# Patient Record
Sex: Female | Born: 1955 | Race: Black or African American | Hispanic: No | Smoking: Former smoker
Health system: Southern US, Community
[De-identification: ages and names within clinical notes are randomized; demographics above are authoritative.]

## PROBLEM LIST (undated history)

## (undated) DIAGNOSIS — J45909 Unspecified asthma, uncomplicated: Secondary | ICD-10-CM

## (undated) DIAGNOSIS — M1990 Unspecified osteoarthritis, unspecified site: Secondary | ICD-10-CM

## (undated) DIAGNOSIS — D649 Anemia, unspecified: Secondary | ICD-10-CM

## (undated) DIAGNOSIS — M419 Scoliosis, unspecified: Secondary | ICD-10-CM

## (undated) DIAGNOSIS — M5410 Radiculopathy, site unspecified: Secondary | ICD-10-CM

## (undated) DIAGNOSIS — F329 Major depressive disorder, single episode, unspecified: Secondary | ICD-10-CM

## (undated) DIAGNOSIS — K219 Gastro-esophageal reflux disease without esophagitis: Secondary | ICD-10-CM

## (undated) DIAGNOSIS — I10 Essential (primary) hypertension: Secondary | ICD-10-CM

## (undated) DIAGNOSIS — E119 Type 2 diabetes mellitus without complications: Secondary | ICD-10-CM

## (undated) DIAGNOSIS — F32A Depression, unspecified: Secondary | ICD-10-CM

## (undated) HISTORY — PX: OTHER SURGICAL HISTORY: SHX169

## (undated) HISTORY — PX: TONSILLECTOMY: SUR1361

## (undated) HISTORY — PX: KNEE ARTHROSCOPY: SUR90

---

## 2017-03-01 NOTE — Progress Notes (Signed)
02-23-17 Surgical clearance from Dr. Sherrin DaisyShough on chart.  09-06-16 EKG on chart

## 2017-03-01 NOTE — Patient Instructions (Signed)
Fredrich Birksatricia Skirvin  03/01/2017   Your procedure is scheduled on: 03-07-17   [facility]  Entrance Report to Admitting at 9:00 AM   Call this number if you have problems the morning of surgery [phone]   Remember: ONLY 1 PERSON MAY GO WITH YOU TO SHORT STAY TO GET  READY MORNING OF YOUR SURGERY.  Do not eat food or drink liquids :After Midnight.     Take these medicines the morning of surgery with A SIP OF WATER: Cymbalta (Duloxetine) and Pantoprazole (Protonix). You may also bring and use your nasal spray as needed.  DO NOT TAKE ANY DIABETIC MEDICATIONS DAY OF YOUR SURGERY                               You may not have any metal on your body including hair pins and              piercings  Do not wear jewelry, make-up, lotions, powders or perfumes, deodorant             Do not wear nail polish.  Do not shave  48 hours prior to surgery.              Do not bring valuables to the hospital. [facility] IS NOT             RESPONSIBLE   FOR VALUABLES.  Contacts, dentures or bridgework may not be worn into surgery.  Leave suitcase in the car. After surgery it may be brought to your room.                 Please read over the following fact sheets you were given: _____________________________________________________________________ How to Manage Your Diabetes Before and After Surgery  Why is it important to control my blood sugar before and after surgery? . Improving blood sugar levels before and after surgery helps healing and can limit problems. . A way of improving blood sugar control is eating a healthy diet by: o  Eating less sugar and carbohydrates o  Increasing activity/exercise o  Talking with your doctor about reaching your blood sugar goals . High blood sugars (greater than 180 mg/dL) can raise your risk of infections and slow your recovery, so you will need to focus on controlling your diabetes during the weeks before  surgery. . Make sure that the doctor who takes care of your diabetes knows about your planned surgery including the date and location.  How do I manage my blood sugar before surgery? . Check your blood sugar at least 4 times a day, starting 2 days before surgery, to make sure that the level is not too high or low. o Check your blood sugar the morning of your surgery when you wake up and every 2 hours until you get to the Short Stay unit. . If your blood sugar is less than 70 mg/dL, you will need to treat for low blood sugar: o Do not take insulin. o Treat a low blood sugar (less than 70 mg/dL) with  cup of clear juice (cranberry or apple), 4 glucose tablets, OR glucose gel. o Recheck blood sugar in 15 minutes after treatment (to make sure it is greater than 70 mg/dL). If your blood sugar is not greater than 70 mg/dL on recheck, call [phone] for further instructions. .Marland Kitchen  Report your blood sugar to the short stay nurse when you get to Short Stay.  . If you are admitted to the hospital after surgery: o Your blood sugar will be checked by the staff and you will probably be given insulin after surgery (instead of oral diabetes medicines) to make sure you have good blood sugar levels. o The goal for blood sugar control after surgery is 80-180 mg/dL.   WHAT DO I DO ABOUT MY DIABETES MEDICATION?  Marland Kitchen. Do not take oral diabetes medicines (pills) the morning of surgery.  . THE DAY BEFORE SURGERY, take your usual dose of Metformin and Januvia        Patient Signature:  Date:   Nurse Signature:  Date:   Reviewed and Endorsed by [facility] Patient Education Committee, August 2015            [facility] - Preparing for Surgery Before surgery, you can play an important role.  Because skin is not sterile, your skin needs to be as free of germs as possible.  You can reduce the number of germs on your skin by washing with CHG (chlorahexidine gluconate) soap before surgery.  CHG is an antiseptic  cleaner which kills germs and bonds with the skin to continue killing germs even after washing. Please DO NOT use if you have an allergy to CHG or antibacterial soaps.  If your skin becomes reddened/irritated stop using the CHG and inform your nurse when you arrive at Short Stay. Do not shave (including legs and underarms) for at least 48 hours prior to the first CHG shower.  You may shave your face/neck. Please follow these instructions carefully:  1.  Shower with CHG Soap the night before surgery and the  morning of Surgery.  2.  If you choose to wash your hair, wash your hair first as usual with your  normal  shampoo.  3.  After you shampoo, rinse your hair and body thoroughly to remove the  shampoo.                           4.  Use CHG as you would any other liquid soap.  You can apply chg directly  to the skin and wash                       Gently with a scrungie or clean washcloth.  5.  Apply the CHG Soap to your body ONLY FROM THE NECK DOWN.   Do not use on face/ open                           Wound or open sores. Avoid contact with eyes, ears mouth and genitals (private parts).                       Wash face,  Genitals (private parts) with your normal soap.             6.  Wash thoroughly, paying special attention to the area where your surgery  will be performed.  7.  Thoroughly rinse your body with warm water from the neck down.  8.  DO NOT shower/wash with your normal soap after using and rinsing off  the CHG Soap.                9.  Pat yourself dry with a clean towel.  10.  Wear clean pajamas.            11.  Place clean sheets on your bed the night of your first shower and do not  sleep with pets. Day of Surgery : Do not apply any lotions/deodorants the morning of surgery.  Please wear clean clothes to the hospital/surgery center.  FAILURE TO FOLLOW THESE INSTRUCTIONS MAY RESULT IN THE CANCELLATION OF YOUR SURGERY PATIENT  SIGNATURE_________________________________  NURSE SIGNATURE__________________________________  ________________________________________________________________________   Adam Phenix  An incentive spirometer is a tool that can help keep your lungs clear and active. This tool measures how well you are filling your lungs with each breath. Taking long deep breaths may help reverse or decrease the chance of developing breathing (pulmonary) problems (especially infection) following:  A long period of time when you are unable to move or be active. BEFORE THE PROCEDURE   If the spirometer includes an indicator to show your best effort, your nurse or respiratory therapist will set it to a desired goal.  If possible, sit up straight or lean slightly forward. Try not to slouch.  Hold the incentive spirometer in an upright position. INSTRUCTIONS FOR USE  1. Sit on the edge of your bed if possible, or sit up as far as you can in bed or on a chair. 2. Hold the incentive spirometer in an upright position. 3. Breathe out normally. 4. Place the mouthpiece in your mouth and seal your lips tightly around it. 5. Breathe in slowly and as deeply as possible, raising the piston or the ball toward the top of the column. 6. Hold your breath for 3-5 seconds or for as long as possible. Allow the piston or ball to fall to the bottom of the column. 7. Remove the mouthpiece from your mouth and breathe out normally. 8. Rest for a few seconds and repeat Steps 1 through 7 at least 10 times every 1-2 hours when you are awake. Take your time and take a few normal breaths between deep breaths. 9. The spirometer may include an indicator to show your best effort. Use the indicator as a goal to work toward during each repetition. 10. After each set of 10 deep breaths, practice coughing to be sure your lungs are clear. If you have an incision (the cut made at the time of surgery), support your incision when coughing  by placing a pillow or rolled up towels firmly against it. Once you are able to get out of bed, walk around indoors and cough well. You may stop using the incentive spirometer when instructed by your caregiver.  RISKS AND COMPLICATIONS  Take your time so you do not get dizzy or light-headed.  If you are in pain, you may need to take or ask for pain medication before doing incentive spirometry. It is harder to take a deep breath if you are having pain. AFTER USE  Rest and breathe slowly and easily.  It can be helpful to keep track of a log of your progress. Your caregiver can provide you with a simple table to help with this. If you are using the spirometer at home, follow these instructions: [place] IF:   You are having difficultly using the spirometer.  You have trouble using the spirometer as often as instructed.  Your pain medication is not giving enough relief while using the spirometer.  You develop fever of 100.5 F (38.1 C) or higher. SEEK IMMEDIATE MEDICAL CARE IF:   You cough up bloody  sputum that had not been present before.  You develop fever of 102 F (38.9 C) or greater.  You develop worsening pain at or near the incision site. MAKE SURE YOU:   Understand these instructions.  Will watch your condition.  Will get help right away if you are not doing well or get worse. Document Released: 07/18/2006 Document Revised: 05/30/2011 Document Reviewed: 09/18/2006 Exit[employer] Patient Information 2014 [employer], [place].   ________________________________________________________________________  WHAT IS A BLOOD TRANSFUSION? Blood Transfusion Information  A transfusion is the replacement of blood or some of its parts. Blood is made up of multiple cells which provide different functions.  Red blood cells carry oxygen and are used for blood loss replacement.  White blood cells fight against infection.  Platelets control bleeding.  Plasma helps clot  blood.  Other blood products are available for specialized needs, such as hemophilia or other clotting disorders. BEFORE THE TRANSFUSION  Who gives blood for transfusions?   Healthy volunteers who are fully evaluated to make sure their blood is safe. This is blood bank blood. Transfusion therapy is the safest it has ever been in the practice of medicine. Before blood is taken from a donor, a complete history is taken to make sure that person has no history of diseases nor engages in risky social behavior (examples are intravenous drug use or sexual activity with multiple partners). The donor's travel history is screened to minimize risk of transmitting infections, such as malaria. The donated blood is tested for signs of infectious diseases, such as HIV and hepatitis. The blood is then tested to be sure it is compatible with you in order to minimize the chance of a transfusion reaction. If you or a relative donates blood, this is often done in anticipation of surgery and is not appropriate for emergency situations. It takes many days to process the donated blood. RISKS AND COMPLICATIONS Although transfusion therapy is very safe and saves many lives, the main dangers of transfusion include:   Getting an infectious disease.  Developing a transfusion reaction. This is an allergic reaction to something in the blood you were given. Every precaution is taken to prevent this. The decision to have a blood transfusion has been considered carefully by your caregiver before blood is given. Blood is not given unless the benefits outweigh the risks. AFTER THE TRANSFUSION  Right after receiving a blood transfusion, you will usually feel much better and more energetic. This is especially true if your red blood cells have gotten low (anemic). The transfusion raises the level of the red blood cells which carry oxygen, and this usually causes an energy increase.  The nurse administering the transfusion will monitor  you carefully for complications. HOME CARE INSTRUCTIONS  No special instructions are needed after a transfusion. You may find your energy is better. Speak with your caregiver about any limitations on activity for underlying diseases you may have. SEEK MEDICAL CARE IF:   Your condition is not improving after your transfusion.  You develop redness or irritation at the intravenous (IV) site. SEEK IMMEDIATE MEDICAL CARE IF:  Any of the following symptoms occur over the next 12 hours:  Shaking chills.  You have a temperature by mouth above 102 F (38.9 C), not controlled by medicine.  Chest, back, or muscle pain.  People around you feel you are not acting correctly or are confused.  Shortness of breath or difficulty breathing.  Dizziness and fainting.  You get a rash or develop hives.  You have a  decrease in urine output.  Your urine turns a dark color or changes to pink, red, or brown. Any of the following symptoms occur over the next 10 days:  You have a temperature by mouth above 102 F (38.9 C), not controlled by medicine.  Shortness of breath.  Weakness after normal activity.  The white part of the eye turns yellow (jaundice).  You have a decrease in the amount of urine or are urinating less often.  Your urine turns a dark color or changes to pink, red, or brown. Document Released: 03/04/2000 Document Revised: 05/30/2011 Document Reviewed: 10/22/2007 [facility] Patient Information 2014 [place], [place].  _______________________________________________________________________

## 2017-03-02 ENCOUNTER — Other Ambulatory Visit: Payer: Self-pay

## 2017-03-02 ENCOUNTER — Encounter (HOSPITAL_COMMUNITY)
Admission: RE | Admit: 2017-03-02 | Discharge: 2017-03-02 | Disposition: A | Discharge status: Home / Self Care | Payer: Medicare HMO | Source: Ambulatory Visit | Attending: Orthopedic Surgery | Admitting: Orthopedic Surgery

## 2017-03-02 ENCOUNTER — Encounter (INDEPENDENT_AMBULATORY_CARE_PROVIDER_SITE_OTHER): Payer: Self-pay

## 2017-03-02 ENCOUNTER — Encounter (HOSPITAL_COMMUNITY): Payer: Self-pay

## 2017-03-02 DIAGNOSIS — J45909 Unspecified asthma, uncomplicated: Secondary | ICD-10-CM | POA: Diagnosis not present

## 2017-03-02 DIAGNOSIS — I10 Essential (primary) hypertension: Secondary | ICD-10-CM | POA: Diagnosis not present

## 2017-03-02 DIAGNOSIS — Z01812 Encounter for preprocedural laboratory examination: Secondary | ICD-10-CM | POA: Insufficient documentation

## 2017-03-02 DIAGNOSIS — K219 Gastro-esophageal reflux disease without esophagitis: Secondary | ICD-10-CM | POA: Diagnosis not present

## 2017-03-02 DIAGNOSIS — E119 Type 2 diabetes mellitus without complications: Secondary | ICD-10-CM | POA: Insufficient documentation

## 2017-03-02 DIAGNOSIS — F329 Major depressive disorder, single episode, unspecified: Secondary | ICD-10-CM | POA: Diagnosis not present

## 2017-03-02 DIAGNOSIS — M25552 Pain in left hip: Secondary | ICD-10-CM | POA: Diagnosis not present

## 2017-03-02 DIAGNOSIS — D649 Anemia, unspecified: Secondary | ICD-10-CM | POA: Insufficient documentation

## 2017-03-02 DIAGNOSIS — M1612 Unilateral primary osteoarthritis, left hip: Secondary | ICD-10-CM | POA: Insufficient documentation

## 2017-03-02 HISTORY — DX: Major depressive disorder, single episode, unspecified: F32.9

## 2017-03-02 HISTORY — DX: Depression, unspecified: F32.A

## 2017-03-02 HISTORY — DX: Gastro-esophageal reflux disease without esophagitis: K21.9

## 2017-03-02 HISTORY — DX: Essential (primary) hypertension: I10

## 2017-03-02 HISTORY — DX: Type 2 diabetes mellitus without complications: E11.9

## 2017-03-02 HISTORY — DX: Anemia, unspecified: D64.9

## 2017-03-02 HISTORY — DX: Unspecified osteoarthritis, unspecified site: M19.90

## 2017-03-02 HISTORY — DX: Unspecified asthma, uncomplicated: J45.909

## 2017-03-02 LAB — BASIC METABOLIC PANEL
Anion gap: 9 (ref 5–15)
BUN: 25 mg/dL — AB (ref 6–20)
CALCIUM: 9.6 mg/dL (ref 8.9–10.3)
CO2: 25 mmol/L (ref 22–32)
CREATININE: 0.78 mg/dL (ref 0.44–1.00)
Chloride: 103 mmol/L (ref 101–111)
GFR calc non Af Amer: >60 mL/min (ref >60)
GLUCOSE: 115 mg/dL — AB (ref 65–99)
Potassium: 4 mmol/L (ref 3.5–5.1)
Sodium: 137 mmol/L (ref 135–145)

## 2017-03-02 LAB — CBC
HCT: 35.1 % — ABNORMAL LOW (ref 36.0–46.0)
Hemoglobin: 11.5 g/dL — ABNORMAL LOW (ref 12.0–15.0)
MCH: 28.8 pg (ref 26.0–34.0)
MCHC: 32.8 g/dL (ref 30.0–36.0)
MCV: 88 fL (ref 78.0–100.0)
PLATELETS: 257 10*3/uL (ref 150–400)
RBC: 3.99 MIL/uL (ref 3.87–5.11)
RDW: 12.9 % (ref 11.5–15.5)
WBC: 8 10*3/uL (ref 4.0–10.5)

## 2017-03-02 LAB — SURGICAL PCR SCREEN
MRSA, PCR: NEGATIVE
Staphylococcus aureus: NEGATIVE

## 2017-03-02 LAB — GLUCOSE, CAPILLARY: GLUCOSE-CAPILLARY: 113 mg/dL — AB (ref 65–99)

## 2017-03-02 LAB — ABO/RH: ABO/RH(D): AB POS

## 2017-03-02 NOTE — Progress Notes (Signed)
03-02-17 BMP result routed to Dr. Charlann Boxerlin for review.

## 2017-03-02 NOTE — H&P (Signed)
TOTAL HIP ADMISSION H&P  Patient is admitted for left total hip arthroplasty, anterior approach.  Subjective:  Chief Complaint:   Left hip primary OA / pain  HPI: Ariel Harrington, 61 y.o. female, has a history of pain and functional disability in the left hip(s) due to arthritis and patient has failed non-surgical conservative treatments for greater than 12 weeks to include  NSAID's and/or analgesics, use of assistive devices and activity modification.  Onset of symptoms was abrupt starting 1+ years ago with rapidlly worsening course since that time.The patient noted no past surgery on the left hip(s).  Patient currently rates pain in the left hip at 10 out of 10 with activity. Patient has night pain, worsening of pain with activity and weight bearing, trendelenberg gait, pain that interfers with activities of daily living and pain with passive range of motion. Patient has evidence of periarticular osteophytes, joint space narrowing and fracture by imaging studies. This condition presents safety issues increasing the risk of falls.  There is no current active infection.  Risks, benefits and expectations were discussed with the patient.  Risks including but not limited to the risk of anesthesia, blood clots, nerve damage, blood vessel damage, failure of the prosthesis, infection and up to and including death.  Patient understand the risks, benefits and expectations and wishes to proceed with surgery.   PCP: Legrand Pitts, PA  D/C Plans:       Home  Post-op Meds:       No Rx given  Tranexamic Acid:      To be given - IV   Decadron:      Is to be given  FYI:     Xarelto (no able to tolerate much ASA)  Oxycodone 5-10  (on tid prior to surgery)  DME:   Pt already has equipment  PT:   No PT    Past Medical History:  Diagnosis Date  . Anemia   . Arthritis   . Asthma   . Depression   . Diabetes mellitus without complication (HCC)   . GERD (gastroesophageal reflux disease)   .  Hypertension     Past Surgical History:  Procedure Laterality Date  . KNEE ARTHROSCOPY Right   . shoulder    . TONSILLECTOMY      No current facility-administered medications for this encounter.    Current Outpatient Medications  Medication Sig Dispense Refill Last Dose  . albuterol (PROVENTIL HFA;VENTOLIN HFA) 108 (90 Base) MCG/ACT inhaler Inhale 2 puffs into the lungs every 6 (six) hours as needed for wheezing or shortness of breath.     . DULoxetine (CYMBALTA) 60 MG capsule Take 60 mg by mouth daily.     . fluticasone (FLONASE) 50 MCG/ACT nasal spray Place 1-2 sprays into both nostrils 2 (two) times daily as needed for allergies or rhinitis.     . hydrocortisone butyrate (LUCOID) 0.1 % CREA cream Apply 1 application topically 3 (three) times daily.     . metFORMIN (GLUCOPHAGE) 1000 MG tablet Take 1,000 mg by mouth 2 (two) times daily with a meal.     . NON FORMULARY Apply 1 application topically 3 (three) times daily. Ketoprofen10%/Baclofen5%/Bupivicane1%     . olmesartan-hydrochlorothiazide (BENICAR HCT) 40-25 MG tablet Take 1 tablet by mouth daily.     Marland Kitchen oxyCODONE-acetaminophen (PERCOCET) 7.5-325 MG tablet Take 1 tablet by mouth 3 (three) times daily as needed for severe pain.     . pantoprazole (PROTONIX) 40 MG tablet Take 40 mg by mouth daily.     Marland Kitchen  sitaGLIPtin (JANUVIA) 50 MG tablet Take 50 mg by mouth daily.      Allergies  Allergen Reactions  . Bactrim [Sulfamethoxazole-Trimethoprim] Shortness Of Breath  . Aspirin Other (See Comments)    Irritates stomach  . Codeine Other (See Comments)    Sweat and get really hot  . Gabapentin Itching  . Ibuprofen Other (See Comments)    Irritates stomach  . Lyrica [Pregabalin] Other (See Comments)    Blister    Social History   Tobacco Use  . Smoking status: Former Smoker    Packs/day: 1.00    Years: 7.00    Pack years: 7.00    Types: Cigarettes    Last attempt to quit: 08/1974    Years since quitting: 42.5  . Smokeless  tobacco: Never Used  Substance Use Topics  . Alcohol use: No    Frequency: Never       Review of Systems  Constitutional: Negative.   HENT: Negative.   Eyes: Negative.   Respiratory: Negative.   Cardiovascular: Negative.   Gastrointestinal: Positive for heartburn.  Genitourinary: Negative.   Musculoskeletal: Positive for joint pain.  Skin: Negative.   Neurological: Negative.   Endo/Heme/Allergies: Negative.   Psychiatric/Behavioral: Positive for depression.    Objective:  Physical Exam  Constitutional: She is oriented to person, place, and time. She appears well-developed.  HENT:  Head: Normocephalic.  Eyes: Pupils are equal, round, and reactive to light.  Neck: Neck supple. No JVD present. No tracheal deviation present. No thyromegaly present.  Cardiovascular: Normal rate, regular rhythm and intact distal pulses.  Respiratory: Effort normal and breath sounds normal. No respiratory distress. She has no wheezes.  GI: Soft. There is no tenderness. There is no guarding.  Musculoskeletal:       Left hip: She exhibits decreased range of motion, decreased strength, tenderness and bony tenderness. She exhibits no swelling, no deformity and no laceration.  Lymphadenopathy:    She has no cervical adenopathy.  Neurological: She is alert and oriented to person, place, and time. A sensory deficit (numbness and tingling in the right toes) is present.  Skin: Skin is warm and dry.  Psychiatric: She has a normal mood and affect.    Vital signs in last 24 hours: Temp:  [98.8 F (37.1 C)] 98.8 F (37.1 C) (12/13 1149) Pulse Rate:  [84] 84 (12/13 1149) Resp:  [18] 18 (12/13 1149) BP: (128)/(68) 128/68 (12/13 1149) SpO2:  [99 %] 99 % (12/13 1149) Weight:  [82.3 kg (181 lb 6 oz)] 82.3 kg (181 lb 6 oz) (12/13 1149)  Labs:   Estimated body mass index is 29.27 kg/m as calculated from the following:   Height as of 03/02/17: 5\' 6"  (1.676 m).   Weight as of 03/02/17: 82.3 kg (181 lb 6  oz).   Imaging Review Plain radiographs demonstrate severe degenerative joint disease of the left hip(s). The bone quality appears to be good for age and reported activity level.  Assessment/Plan:  End stage arthritis, left hip  The patient history, physical examination, clinical judgement of the provider and imaging studies are consistent with end stage degenerative joint disease of the left hip(s) and total hip arthroplasty is deemed medically necessary. The treatment options including medical management, injection therapy, arthroscopy and arthroplasty were discussed at length. The risks and benefits of total hip arthroplasty were presented and reviewed. The risks due to aseptic loosening, infection, stiffness, dislocation/subluxation,  thromboembolic complications and other imponderables were discussed.  The patient acknowledged the explanation, agreed  to proceed with the plan and consent was signed. Patient is being admitted for inpatient treatment for surgery, pain control, PT, OT, prophylactic antibiotics, VTE prophylaxis, progressive ambulation and ADL's and discharge planning.The patient is planning to be discharged home.    Anastasio AuerbachMatthew S. Shamirah Ivan   PA-C  03/02/2017, 2:00 PM

## 2017-03-03 NOTE — Pre-Procedure Instructions (Signed)
Spoke with Jacki ConesLaurie in lab about HGB A1C results stated [facility] machine went down so the labs may have been sent to [facility].  I will check back for results on Monday, 03/06/17.

## 2017-03-04 LAB — HEMOGLOBIN A1C
HEMOGLOBIN A1C: 6.6 % — AB (ref 4.8–5.6)
MEAN PLASMA GLUCOSE: 143 mg/dL

## 2017-03-06 NOTE — Pre-Procedure Instructions (Signed)
Hgb A 1 C faxed to Dr. Charlann Boxerlin via epic.

## 2017-03-07 ENCOUNTER — Other Ambulatory Visit: Payer: Self-pay

## 2017-03-07 ENCOUNTER — Encounter (HOSPITAL_COMMUNITY): Payer: Self-pay

## 2017-03-07 ENCOUNTER — Inpatient Hospital Stay (HOSPITAL_COMMUNITY): Payer: Medicare HMO | Admitting: Anesthesiology

## 2017-03-07 ENCOUNTER — Inpatient Hospital Stay (HOSPITAL_COMMUNITY): Payer: Medicare HMO

## 2017-03-07 ENCOUNTER — Encounter (HOSPITAL_COMMUNITY)
Admission: RE | Disposition: A | Discharge status: Home / Self Care | Payer: Self-pay | Source: Ambulatory Visit | Attending: Orthopedic Surgery

## 2017-03-07 ENCOUNTER — Inpatient Hospital Stay (HOSPITAL_COMMUNITY)
Admission: RE | Admit: 2017-03-07 | Discharge: 2017-03-10 | DRG: 470 | Disposition: A | Discharge status: Home / Self Care | Payer: Medicare HMO | Source: Ambulatory Visit | Attending: Orthopedic Surgery | Admitting: Orthopedic Surgery

## 2017-03-07 DIAGNOSIS — Z87891 Personal history of nicotine dependence: Secondary | ICD-10-CM | POA: Diagnosis not present

## 2017-03-07 DIAGNOSIS — Z79899 Other long term (current) drug therapy: Secondary | ICD-10-CM

## 2017-03-07 DIAGNOSIS — F329 Major depressive disorder, single episode, unspecified: Secondary | ICD-10-CM | POA: Diagnosis present

## 2017-03-07 DIAGNOSIS — Z886 Allergy status to analgesic agent status: Secondary | ICD-10-CM | POA: Diagnosis not present

## 2017-03-07 DIAGNOSIS — Z881 Allergy status to other antibiotic agents status: Secondary | ICD-10-CM | POA: Diagnosis not present

## 2017-03-07 DIAGNOSIS — M8588 Other specified disorders of bone density and structure, other site: Secondary | ICD-10-CM | POA: Diagnosis present

## 2017-03-07 DIAGNOSIS — M1612 Unilateral primary osteoarthritis, left hip: Secondary | ICD-10-CM | POA: Diagnosis present

## 2017-03-07 DIAGNOSIS — Z9181 History of falling: Secondary | ICD-10-CM | POA: Diagnosis not present

## 2017-03-07 DIAGNOSIS — Z885 Allergy status to narcotic agent status: Secondary | ICD-10-CM

## 2017-03-07 DIAGNOSIS — E663 Overweight: Secondary | ICD-10-CM | POA: Diagnosis present

## 2017-03-07 DIAGNOSIS — D62 Acute posthemorrhagic anemia: Secondary | ICD-10-CM | POA: Diagnosis not present

## 2017-03-07 DIAGNOSIS — I10 Essential (primary) hypertension: Secondary | ICD-10-CM | POA: Diagnosis present

## 2017-03-07 DIAGNOSIS — Z888 Allergy status to other drugs, medicaments and biological substances status: Secondary | ICD-10-CM

## 2017-03-07 DIAGNOSIS — J45909 Unspecified asthma, uncomplicated: Secondary | ICD-10-CM | POA: Diagnosis present

## 2017-03-07 DIAGNOSIS — M25552 Pain in left hip: Secondary | ICD-10-CM | POA: Diagnosis present

## 2017-03-07 DIAGNOSIS — Z7984 Long term (current) use of oral hypoglycemic drugs: Secondary | ICD-10-CM | POA: Diagnosis not present

## 2017-03-07 DIAGNOSIS — Z96649 Presence of unspecified artificial hip joint: Secondary | ICD-10-CM

## 2017-03-07 DIAGNOSIS — M659 Synovitis and tenosynovitis, unspecified: Secondary | ICD-10-CM | POA: Diagnosis present

## 2017-03-07 DIAGNOSIS — Z6829 Body mass index (BMI) 29.0-29.9, adult: Secondary | ICD-10-CM

## 2017-03-07 DIAGNOSIS — K219 Gastro-esophageal reflux disease without esophagitis: Secondary | ICD-10-CM | POA: Diagnosis present

## 2017-03-07 DIAGNOSIS — E119 Type 2 diabetes mellitus without complications: Secondary | ICD-10-CM | POA: Diagnosis present

## 2017-03-07 HISTORY — PX: TOTAL HIP ARTHROPLASTY: SHX124

## 2017-03-07 LAB — GLUCOSE, CAPILLARY
GLUCOSE-CAPILLARY: 111 mg/dL — AB (ref 65–99)
GLUCOSE-CAPILLARY: 211 mg/dL — AB (ref 65–99)
Glucose-Capillary: 186 mg/dL — ABNORMAL HIGH (ref 65–99)
Glucose-Capillary: 144 mg/dL — ABNORMAL HIGH (ref 65–99)

## 2017-03-07 SURGERY — ARTHROPLASTY, HIP, TOTAL, ANTERIOR APPROACH
Anesthesia: Spinal | Site: Hip | Laterality: Left

## 2017-03-07 MED ORDER — FENTANYL CITRATE (PF) 100 MCG/2ML IJ SOLN
INTRAMUSCULAR | Status: AC
  Filled 2017-03-07: qty 2

## 2017-03-07 MED ORDER — ONDANSETRON HCL 4 MG/2ML IJ SOLN: 4.0000 mg | Freq: Four times a day (QID) | INTRAMUSCULAR | Status: DC | PRN

## 2017-03-07 MED ORDER — CEFAZOLIN SODIUM-DEXTROSE 2-4 GM/100ML-% IV SOLN
2.0000 g | Freq: Four times a day (QID) | INTRAVENOUS | Status: AC
  Administered 2017-03-07 (×2): 2 g via INTRAVENOUS
  Filled 2017-03-07 (×2): qty 100

## 2017-03-07 MED ORDER — PROPOFOL 500 MG/50ML IV EMUL
INTRAVENOUS | Status: DC | PRN
  Administered 2017-03-07: 50 ug/kg/min via INTRAVENOUS

## 2017-03-07 MED ORDER — TRANEXAMIC ACID 1000 MG/10ML IV SOLN
1000.0000 mg | Freq: Once | INTRAVENOUS | Status: AC
  Administered 2017-03-07: 1000 mg via INTRAVENOUS
  Filled 2017-03-07: qty 1100

## 2017-03-07 MED ORDER — STERILE WATER FOR IRRIGATION IR SOLN
Status: DC | PRN
  Administered 2017-03-07: 2000 mL

## 2017-03-07 MED ORDER — DULOXETINE HCL 60 MG PO CPEP
60.0000 mg | ORAL_CAPSULE | Freq: Every day | ORAL | Status: DC
  Administered 2017-03-08 – 2017-03-10 (×3): 60 mg via ORAL
  Filled 2017-03-07 (×3): qty 1

## 2017-03-07 MED ORDER — FLUTICASONE PROPIONATE 50 MCG/ACT NA SUSP
1.0000 | Freq: Two times a day (BID) | NASAL | Status: DC | PRN
  Filled 2017-03-07: qty 16

## 2017-03-07 MED ORDER — MIDAZOLAM HCL 5 MG/5ML IJ SOLN
INTRAMUSCULAR | Status: DC | PRN
  Administered 2017-03-07: 2 mg via INTRAVENOUS

## 2017-03-07 MED ORDER — FENTANYL CITRATE (PF) 100 MCG/2ML IJ SOLN
INTRAMUSCULAR | Status: DC | PRN
  Administered 2017-03-07: 100 ug via INTRAVENOUS

## 2017-03-07 MED ORDER — DOCUSATE SODIUM 100 MG PO CAPS
100.0000 mg | ORAL_CAPSULE | Freq: Two times a day (BID) | ORAL | Status: DC
  Administered 2017-03-07 – 2017-03-10 (×6): 100 mg via ORAL
  Filled 2017-03-07 (×6): qty 1

## 2017-03-07 MED ORDER — METHOCARBAMOL 1000 MG/10ML IJ SOLN
500.0000 mg | Freq: Four times a day (QID) | INTRAMUSCULAR | Status: DC | PRN
  Administered 2017-03-07: 500 mg via INTRAVENOUS
  Filled 2017-03-07: qty 550

## 2017-03-07 MED ORDER — HYDROCHLOROTHIAZIDE 25 MG PO TABS
25.0000 mg | ORAL_TABLET | Freq: Every day | ORAL | Status: DC
  Administered 2017-03-07 – 2017-03-10 (×3): 25 mg via ORAL
  Filled 2017-03-07 (×4): qty 1

## 2017-03-07 MED ORDER — PROPOFOL 10 MG/ML IV BOLUS
INTRAVENOUS | Status: AC
Start: 2017-03-07 — End: 2017-03-07
  Filled 2017-03-07: qty 40

## 2017-03-07 MED ORDER — RIVAROXABAN 10 MG PO TABS
10.0000 mg | ORAL_TABLET | ORAL | Status: DC
  Administered 2017-03-08 – 2017-03-10 (×3): 10 mg via ORAL
  Filled 2017-03-07 (×3): qty 1

## 2017-03-07 MED ORDER — DOCUSATE SODIUM 100 MG PO CAPS: 100.0000 mg | ORAL_CAPSULE | Freq: Two times a day (BID) | ORAL | 0 refills | Status: DC

## 2017-03-07 MED ORDER — ONDANSETRON HCL 4 MG PO TABS: 4.0000 mg | ORAL_TABLET | Freq: Four times a day (QID) | ORAL | Status: DC | PRN

## 2017-03-07 MED ORDER — CEFAZOLIN SODIUM-DEXTROSE 2-4 GM/100ML-% IV SOLN
INTRAVENOUS | Status: AC
  Filled 2017-03-07: qty 100

## 2017-03-07 MED ORDER — DIPHENHYDRAMINE HCL 12.5 MG/5ML PO ELIX: 12.5000 mg | ORAL_SOLUTION | ORAL | Status: DC | PRN

## 2017-03-07 MED ORDER — PROPOFOL 10 MG/ML IV BOLUS
INTRAVENOUS | Status: AC
  Filled 2017-03-07: qty 20

## 2017-03-07 MED ORDER — INSULIN ASPART 100 UNIT/ML ~~LOC~~ SOLN
0.0000 [IU] | Freq: Three times a day (TID) | SUBCUTANEOUS | Status: DC
  Administered 2017-03-07: 19:00:00 5 [IU] via SUBCUTANEOUS
  Administered 2017-03-08: 17:00:00 3 [IU] via SUBCUTANEOUS
  Administered 2017-03-08: 08:00:00 2 [IU] via SUBCUTANEOUS
  Administered 2017-03-08: 5 [IU] via SUBCUTANEOUS
  Administered 2017-03-09: 13:00:00 3 [IU] via SUBCUTANEOUS
  Administered 2017-03-09 (×2): 2 [IU] via SUBCUTANEOUS
  Administered 2017-03-10: 14:00:00 3 [IU] via SUBCUTANEOUS
  Administered 2017-03-10: 09:00:00 2 [IU] via SUBCUTANEOUS

## 2017-03-07 MED ORDER — FERROUS SULFATE 325 (65 FE) MG PO TABS
325.0000 mg | ORAL_TABLET | Freq: Three times a day (TID) | ORAL | Status: DC
  Administered 2017-03-08 – 2017-03-10 (×8): 325 mg via ORAL
  Filled 2017-03-07 (×8): qty 1

## 2017-03-07 MED ORDER — RIVAROXABAN 10 MG PO TABS: 10.0000 mg | ORAL_TABLET | Freq: Every day | ORAL | 0 refills | Status: DC

## 2017-03-07 MED ORDER — ACETAMINOPHEN 500 MG PO TABS: 1000.0000 mg | ORAL_TABLET | Freq: Three times a day (TID) | ORAL | 0 refills | Status: DC

## 2017-03-07 MED ORDER — ACETAMINOPHEN 650 MG RE SUPP: 650.0000 mg | RECTAL | Status: DC | PRN

## 2017-03-07 MED ORDER — ONDANSETRON HCL 4 MG/2ML IJ SOLN
INTRAMUSCULAR | Status: DC | PRN
  Administered 2017-03-07: 4 mg via INTRAVENOUS

## 2017-03-07 MED ORDER — METOCLOPRAMIDE HCL 5 MG PO TABS: 5.0000 mg | ORAL_TABLET | Freq: Three times a day (TID) | ORAL | Status: DC | PRN

## 2017-03-07 MED ORDER — OXYCODONE HCL 5 MG PO TABS
ORAL_TABLET | ORAL | Status: AC
  Filled 2017-03-07: qty 1

## 2017-03-07 MED ORDER — OXYCODONE HCL 5 MG PO TABS: 5.0000 mg | ORAL_TABLET | ORAL | 0 refills | Status: DC | PRN

## 2017-03-07 MED ORDER — TRANEXAMIC ACID 1000 MG/10ML IV SOLN
1000.0000 mg | INTRAVENOUS | Status: AC
  Administered 2017-03-07: 1000 mg via INTRAVENOUS
  Filled 2017-03-07: qty 1100

## 2017-03-07 MED ORDER — ACETAMINOPHEN 325 MG PO TABS
650.0000 mg | ORAL_TABLET | ORAL | Status: DC | PRN
  Administered 2017-03-09: 22:00:00 650 mg via ORAL
  Filled 2017-03-07: qty 2

## 2017-03-07 MED ORDER — OLMESARTAN MEDOXOMIL-HCTZ 40-25 MG PO TABS: 1.0000 | ORAL_TABLET | Freq: Every day | ORAL | Status: DC

## 2017-03-07 MED ORDER — MENTHOL 3 MG MT LOZG: 1.0000 | LOZENGE | OROMUCOSAL | Status: DC | PRN

## 2017-03-07 MED ORDER — MAGNESIUM CITRATE PO SOLN: 1.0000 | Freq: Once | ORAL | Status: DC | PRN

## 2017-03-07 MED ORDER — ONDANSETRON HCL 4 MG/2ML IJ SOLN
INTRAMUSCULAR | Status: AC
  Filled 2017-03-07: qty 2

## 2017-03-07 MED ORDER — CHLORHEXIDINE GLUCONATE 4 % EX LIQD: 60.0000 mL | Freq: Once | CUTANEOUS | Status: DC

## 2017-03-07 MED ORDER — METHOCARBAMOL 500 MG PO TABS
500.0000 mg | ORAL_TABLET | Freq: Four times a day (QID) | ORAL | Status: DC | PRN
  Administered 2017-03-07 – 2017-03-10 (×7): 500 mg via ORAL
  Filled 2017-03-07 (×7): qty 1

## 2017-03-07 MED ORDER — MIDAZOLAM HCL 2 MG/2ML IJ SOLN
INTRAMUSCULAR | Status: AC
  Filled 2017-03-07: qty 2

## 2017-03-07 MED ORDER — DEXAMETHASONE SODIUM PHOSPHATE 10 MG/ML IJ SOLN
INTRAMUSCULAR | Status: AC
Start: 2017-03-07 — End: 2017-03-07
  Filled 2017-03-07: qty 1

## 2017-03-07 MED ORDER — ALUM & MAG HYDROXIDE-SIMETH 200-200-20 MG/5ML PO SUSP: 15.0000 mL | ORAL | Status: DC | PRN

## 2017-03-07 MED ORDER — DEXAMETHASONE SODIUM PHOSPHATE 10 MG/ML IJ SOLN
10.0000 mg | Freq: Once | INTRAMUSCULAR | Status: AC
  Administered 2017-03-08: 10 mg via INTRAVENOUS
  Filled 2017-03-07: qty 1

## 2017-03-07 MED ORDER — METOCLOPRAMIDE HCL 5 MG/ML IJ SOLN: 5.0000 mg | Freq: Three times a day (TID) | INTRAMUSCULAR | Status: DC | PRN

## 2017-03-07 MED ORDER — ACETAMINOPHEN 10 MG/ML IV SOLN
INTRAVENOUS | Status: AC
  Filled 2017-03-07: qty 100

## 2017-03-07 MED ORDER — BISACODYL 10 MG RE SUPP: 10.0000 mg | Freq: Every day | RECTAL | Status: DC | PRN

## 2017-03-07 MED ORDER — PHENOL 1.4 % MT LIQD
1.0000 | OROMUCOSAL | Status: DC | PRN
  Filled 2017-03-07: qty 177

## 2017-03-07 MED ORDER — PANTOPRAZOLE SODIUM 40 MG PO TBEC
40.0000 mg | DELAYED_RELEASE_TABLET | Freq: Every day | ORAL | Status: DC
  Administered 2017-03-08 – 2017-03-10 (×3): 40 mg via ORAL
  Filled 2017-03-07 (×3): qty 1

## 2017-03-07 MED ORDER — ALBUTEROL SULFATE (2.5 MG/3ML) 0.083% IN NEBU
2.5000 mg | INHALATION_SOLUTION | Freq: Four times a day (QID) | RESPIRATORY_TRACT | Status: DC | PRN
Start: 2017-03-07 — End: 2017-03-10

## 2017-03-07 MED ORDER — PROMETHAZINE HCL 25 MG/ML IJ SOLN: 6.2500 mg | INTRAMUSCULAR | Status: DC | PRN

## 2017-03-07 MED ORDER — SODIUM CHLORIDE 0.9 % IR SOLN
Status: DC | PRN
  Administered 2017-03-07: 1000 mL

## 2017-03-07 MED ORDER — CEFAZOLIN SODIUM-DEXTROSE 2-4 GM/100ML-% IV SOLN
2.0000 g | INTRAVENOUS | Status: AC
  Administered 2017-03-07: 2 g via INTRAVENOUS

## 2017-03-07 MED ORDER — SODIUM CHLORIDE 0.9 % IV SOLN
INTRAVENOUS | Status: DC
  Administered 2017-03-07 – 2017-03-10 (×2): via INTRAVENOUS

## 2017-03-07 MED ORDER — MEPERIDINE HCL 50 MG/ML IJ SOLN: 6.2500 mg | INTRAMUSCULAR | Status: DC | PRN

## 2017-03-07 MED ORDER — DEXAMETHASONE SODIUM PHOSPHATE 10 MG/ML IJ SOLN
10.0000 mg | Freq: Once | INTRAMUSCULAR | Status: AC
  Administered 2017-03-07: 10 mg via INTRAVENOUS

## 2017-03-07 MED ORDER — ACETAMINOPHEN 10 MG/ML IV SOLN
1000.0000 mg | Freq: Once | INTRAVENOUS | Status: DC | PRN
  Administered 2017-03-07: 1000 mg via INTRAVENOUS

## 2017-03-07 MED ORDER — IRBESARTAN 150 MG PO TABS
300.0000 mg | ORAL_TABLET | Freq: Every day | ORAL | Status: DC
  Administered 2017-03-07 – 2017-03-10 (×3): 300 mg via ORAL
  Filled 2017-03-07 (×5): qty 2

## 2017-03-07 MED ORDER — LACTATED RINGERS IV SOLN
INTRAVENOUS | Status: DC
  Administered 2017-03-07: 1000 mL via INTRAVENOUS
  Administered 2017-03-07 (×2): via INTRAVENOUS

## 2017-03-07 MED ORDER — LINAGLIPTIN 5 MG PO TABS
5.0000 mg | ORAL_TABLET | Freq: Every day | ORAL | Status: DC
  Administered 2017-03-07 – 2017-03-10 (×4): 5 mg via ORAL
  Filled 2017-03-07 (×4): qty 1

## 2017-03-07 MED ORDER — ALBUMIN HUMAN 5 % IV SOLN
INTRAVENOUS | Status: DC | PRN
  Administered 2017-03-07: 13:00:00 via INTRAVENOUS

## 2017-03-07 MED ORDER — FERROUS SULFATE 325 (65 FE) MG PO TABS: 325.0000 mg | ORAL_TABLET | Freq: Three times a day (TID) | ORAL | 3 refills | Status: DC

## 2017-03-07 MED ORDER — POLYETHYLENE GLYCOL 3350 17 G PO PACK: 17.0000 g | PACK | Freq: Two times a day (BID) | ORAL | 0 refills | Status: DC

## 2017-03-07 MED ORDER — FENTANYL CITRATE (PF) 100 MCG/2ML IJ SOLN
25.0000 ug | INTRAMUSCULAR | Status: DC | PRN
  Administered 2017-03-07 (×3): 50 ug via INTRAVENOUS

## 2017-03-07 MED ORDER — METHOCARBAMOL 500 MG PO TABS: 500.0000 mg | ORAL_TABLET | Freq: Four times a day (QID) | ORAL | 0 refills | Status: DC | PRN

## 2017-03-07 MED ORDER — METFORMIN HCL 500 MG PO TABS
1000.0000 mg | ORAL_TABLET | Freq: Two times a day (BID) | ORAL | Status: DC
  Administered 2017-03-07 – 2017-03-10 (×6): 1000 mg via ORAL
  Filled 2017-03-07 (×6): qty 2

## 2017-03-07 MED ORDER — OXYCODONE HCL 5 MG PO TABS
10.0000 mg | ORAL_TABLET | ORAL | Status: DC | PRN
  Administered 2017-03-07 – 2017-03-10 (×14): 10 mg via ORAL
  Filled 2017-03-07 (×14): qty 2

## 2017-03-07 MED ORDER — BUPIVACAINE HCL (PF) 0.75 % IJ SOLN
INTRAMUSCULAR | Status: DC | PRN
  Administered 2017-03-07: 2 mL via INTRATHECAL

## 2017-03-07 MED ORDER — PROPOFOL 10 MG/ML IV BOLUS
INTRAVENOUS | Status: AC
Start: 2017-03-07 — End: 2017-03-07
  Filled 2017-03-07: qty 20

## 2017-03-07 MED ORDER — HYDROCORTISONE BUTYRATE 0.1 % EX CREA: 1.0000 "application " | TOPICAL_CREAM | Freq: Three times a day (TID) | CUTANEOUS | Status: DC

## 2017-03-07 MED ORDER — HYDROMORPHONE HCL 1 MG/ML IJ SOLN
0.5000 mg | INTRAMUSCULAR | Status: DC | PRN
  Administered 2017-03-07 – 2017-03-08 (×3): 1 mg via INTRAVENOUS
  Filled 2017-03-07 (×3): qty 1

## 2017-03-07 MED ORDER — OXYCODONE HCL 5 MG PO TABS
5.0000 mg | ORAL_TABLET | ORAL | Status: DC | PRN
  Administered 2017-03-07 – 2017-03-08 (×3): 5 mg via ORAL
  Filled 2017-03-07 (×2): qty 1

## 2017-03-07 MED ORDER — POLYETHYLENE GLYCOL 3350 17 G PO PACK
17.0000 g | PACK | Freq: Two times a day (BID) | ORAL | Status: DC
  Administered 2017-03-08 – 2017-03-10 (×5): 17 g via ORAL
  Filled 2017-03-07 (×5): qty 1

## 2017-03-07 SURGICAL SUPPLY — 37 items
BAG DECANTER FOR FLEXI CONT (MISCELLANEOUS) IMPLANT
BAG ZIPLOCK 12X15 (MISCELLANEOUS) IMPLANT
BLADE SAG 18X100X1.27 (BLADE) ×3 IMPLANT
CAPT HIP TOTAL 2 ×3 IMPLANT
CLOTH BEACON ORANGE TIMEOUT ST (SAFETY) ×3 IMPLANT
COVER PERINEAL POST (MISCELLANEOUS) ×3 IMPLANT
COVER SURGICAL LIGHT HANDLE (MISCELLANEOUS) ×3 IMPLANT
DERMABOND ADVANCED (GAUZE/BANDAGES/DRESSINGS) ×2
DERMABOND ADVANCED .7 DNX12 (GAUZE/BANDAGES/DRESSINGS) ×1 IMPLANT
DRAPE STERI IOBAN 125X83 (DRAPES) ×3 IMPLANT
DRAPE U-SHAPE 47X51 STRL (DRAPES) ×6 IMPLANT
DRESSING AQUACEL AG SP 3.5X10 (GAUZE/BANDAGES/DRESSINGS) ×1 IMPLANT
DRSG AQUACEL AG SP 3.5X10 (GAUZE/BANDAGES/DRESSINGS) ×3
DURAPREP 26ML APPLICATOR (WOUND CARE) ×3 IMPLANT
ELECT REM PT RETURN 15FT ADLT (MISCELLANEOUS) ×3 IMPLANT
GLOVE BIOGEL M STRL SZ7.5 (GLOVE) ×6 IMPLANT
GLOVE BIOGEL PI IND STRL 7.5 (GLOVE) ×7 IMPLANT
GLOVE BIOGEL PI IND STRL 8.5 (GLOVE) ×1 IMPLANT
GLOVE BIOGEL PI INDICATOR 7.5 (GLOVE) ×14
GLOVE BIOGEL PI INDICATOR 8.5 (GLOVE) ×2
GLOVE ECLIPSE 8.0 STRL XLNG CF (GLOVE) IMPLANT
GLOVE ORTHO TXT STRL SZ7.5 (GLOVE) IMPLANT
GOWN STRL REUS W/TWL LRG LVL3 (GOWN DISPOSABLE) ×6 IMPLANT
GOWN STRL REUS W/TWL XL LVL3 (GOWN DISPOSABLE) ×6 IMPLANT
GRAFT IC CHAMBER LG (Bone Implant) ×3 IMPLANT
GRAFT IC CHAMBER MED (Bone Implant) ×3 IMPLANT
HOLDER FOLEY CATH W/STRAP (MISCELLANEOUS) ×3 IMPLANT
PACK ANTERIOR HIP CUSTOM (KITS) ×3 IMPLANT
SUT MNCRL AB 4-0 PS2 18 (SUTURE) ×3 IMPLANT
SUT STRATAFIX 0 PDS 27 VIOLET (SUTURE) ×3
SUT VIC AB 1 CT1 36 (SUTURE) ×9 IMPLANT
SUT VIC AB 2-0 CT1 27 (SUTURE) ×4
SUT VIC AB 2-0 CT1 TAPERPNT 27 (SUTURE) ×2 IMPLANT
SUTURE STRATFX 0 PDS 27 VIOLET (SUTURE) ×1 IMPLANT
TRAY FOLEY CATH 14FR (SET/KITS/TRAYS/PACK) ×3 IMPLANT
WATER STERILE IRR 1000ML POUR (IV SOLUTION) ×6 IMPLANT
YANKAUER SUCT BULB TIP 10FT TU (MISCELLANEOUS) IMPLANT

## 2017-03-07 NOTE — Op Note (Signed)
NAME:  Ariel Birksatricia Dauber                ACCOUNT NO.: [id number]      MEDICAL RECORD NO.: [id number]      FACILITY:  [facility]      PHYSICIAN:  Shelda PalMatthew D Hawraa Stambaugh  DATE OF BIRTH:  [birth year]     DATE OF PROCEDURE:  03/07/2017                                 OPERATIVE REPORT         PREOPERATIVE DIAGNOSIS: Left  hip osteoarthritis with severe femoral head deformity and associated severe bone loss deformity to acetabulum     POSTOPERATIVE DIAGNOSIS:  Left hip osteoarthritis with severe femoral head deformity and associated severe bone loss deformity to acetabulum      PROCEDURE:  Left total hip replacement through an anterior approach   utilizing DePuy THR system, component size 56mm GRIPTION pinnacle cup, a size 36+4 neutral   Altrex liner, a size 4 Hi Tri Lock stem with a 36+1.5 delta ceramic   ball. With 25 cc of allograft bone graft     SURGEON:  Madlyn FrankelMatthew D. Charlann Boxerlin, M.D.      ASSISTANT:  Skip MayerBlair Roberts, PA-C     ANESTHESIA:  Spinal.      SPECIMENS:  None.      COMPLICATIONS:  None.      BLOOD LOSS:  1400 cc     DRAINS:  None.      INDICATION OF THE PROCEDURE:  Ariel Harrington is a 61 y.o. female who had   presented to office for evaluation of left hip pain.  Radiographs revealed   progressive degenerative changes with bone-on-bone   articulation to the  hip joint.  The patient had painful limited range of   motion significantly affecting their overall quality of life.  The patient was failing to    respond to conservative measures, and at this point was ready   to proceed with more definitive measures.  The patient has noted progressive   degenerative changes in his hip, progressive problems and dysfunction   with regarding the hip prior to surgery.  Consent was obtained for   benefit of pain relief.  Specific risk of infection, DVT, component   failure, dislocation, need for revision surgery, as well discussion of   the anterior versus posterior  approach were reviewed.  Consent was   obtained for benefit of anterior pain relief through an anterior   approach.      PROCEDURE IN DETAIL:  The patient was brought to operative theater.   Once adequate anesthesia, preoperative antibiotics, 2 gm of Ancef, 1 gm of Tranexamic Acid, and 10 mg of Decadron administered.   The patient was positioned supine on the OSI Hanna table.  Once adequate   padding of boney process was carried out, we had predraped out the hip, and  used fluoroscopy to confirm orientation of the pelvis and position.      The left hip was then prepped and draped from proximal iliac crest to   mid thigh with shower curtain technique.      Time-out was performed identifying the patient, planned procedure, and   extremity.     An incision was then made 2 cm distal and lateral to the   anterior superior iliac spine extending over the orientation of the   tensor  fascia lata muscle and sharp dissection was carried down to the   fascia of the muscle and protractor placed in the soft tissues.      The fascia was then incised.  The muscle belly was identified and swept   laterally and retractor placed along the superior neck.  Following   cauterization of the circumflex vessels and removing some pericapsular   fat, a second cobra retractor was placed on the inferior neck.  A third   retractor was placed on the anterior acetabulum after elevating the   anterior rectus.  A L-capsulotomy was along the line of the   superior neck to the trochanteric fossa, then extended proximally and   distally.  Tag sutures were placed and the retractors were then placed   intracapsular.  We then identified the trochanteric fossa and   orientation of my neck cut, confirmed this radiographically   and then made a neck osteotomy with the femur on traction.  The femoral   head was removed without difficulty or complication.  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  This finding was confirmed when the femoral head was removed noting severe deformity and significant loss of normal bone structure.  Traction was let   off and retractors were placed posterior and anterior around the   acetabulum.     At this point I was able to evaluate the acetabulum.  There was a significant amount of inflamed and abnormal tissue around the acetabulum related to this advanced degenerative changes and pathology in the hip joint.  There was a significant amount of synovitis and inflammation.  At this point I did end up resecting her capsule due to the disease present.  I also had to remove a significant soft tissue and inflamed tissue around the acetabulum which contributed to the blood loss noted above.  Additionally, the labrum and foveal tissue were debrided.  Based on the significant deformity and anatomy of her acetabulum I began reaming with a 44mm   reamer and reamed up to 55mm reamer.  With this reamer I was able to get some good bone preparation.  She was still noted to have significant defect in the superior aspect of the acetabulum as well as some medial defect.  Her anterior and posterior columns remained intact.  Based on the quality of the bone present I selected a 56 mm Gription Pinnacle acetabular shell.  Prior to impacting the final cup I did place approximately 25 cc of allograft bone graft cancellus chips into the acetabulum and reverse reamed with a 54 mm reamer.  The final 56mm Gription Pinnacle cup was then impacted under fluoroscopy  to confirm the depth of penetration and orientation with respect to   abduction and forward flexion.  A screw was placed followed by the hole eliminator.  The final   36+4 neutral Altrex liner was impacted with good visualized rim fit.  The cup was positioned anatomically within the acetabular portion of the pelvis.      At this point, the femur  was rolled at 80 degrees.  Further capsule was   released off the inferior aspect of the femoral neck.  I then   released the superior capsule proximally.  The hook was placed laterally   along the femur and elevated manually and held in position with the bed   hook.  The leg was then extended and adducted with  the leg rolled to 100   degrees of external rotation.  Once the proximal femur was fully   exposed, I used a box osteotome to set orientation.  I then began   broaching with the starting chili pepper broach and passed this by hand and then broached up to 4.  With the 4 broach in place I chose a high offset neck and did several trial reductions.  The offset was appropriate, leg lengths   appeared to be equal best matched with the +1.5 head ball confirmed radiographically.  I was able to restore her leg length back to near normal as compared to the significant shortening related to her hip joint process prior to this.  Given these findings, I went ahead and dislocated the hip, repositioned all   retractors and positioned the right hip in the extended and abducted position.  The final 4 Hi  Tri Lock stem was   chosen and it was impacted down to the level of neck cut.  Based on this   and the trial reduction, a 36+ delta ceramic ball was chosen and   impacted onto a clean and dry trunnion, and the hip was reduced.  The   hip had been irrigated throughout the case again at this point.  The fascia of the   tensor fascia lata muscle was then reapproximated using #1 Vicryl and #0 Stratafix sutures.  The   remaining wound was closed with 2-0 Vicryl and running 4-0 Monocryl.   The hip was cleaned, dried, and dressed sterilely using Dermabond and   Aquacel dressing.  She was then brought   to recovery room in stable condition tolerating the procedure well.   The intraoperative findings were reviewed with her family.  I will have her probably remain in the hospital for 2 days to make certain that  her hemoglobin is stable related to the acute blood loss anemia.  I will also make certain that she is adequately trained with precautions of partial weightbearing on this left lower extremity.   Skip MayerBlair Roberts, PA-C was present for the entirety of the case involved from   preoperative positioning, perioperative retractor management, general   facilitation of the case, as well as primary wound closure as assistant.            Madlyn FrankelMatthew D. Charlann Boxerlin, M.D.        03/07/2017 11:20 AM

## 2017-03-07 NOTE — Anesthesia Preprocedure Evaluation (Signed)
Anesthesia Evaluation  Patient identified by MRN, date of birth, ID band Patient awake    Reviewed: Allergy & Precautions, NPO status , Patient's Chart, lab work & pertinent test results  Airway Mallampati: I       Dental no notable dental hx. (+) Teeth Intact   Pulmonary former smoker,    Pulmonary exam normal breath sounds clear to auscultation       Cardiovascular hypertension, Pt. on medications Normal cardiovascular exam Rhythm:Regular Rate:Normal     Neuro/Psych PSYCHIATRIC DISORDERS Depression negative neurological ROS     GI/Hepatic Neg liver ROS, GERD  Medicated and Controlled,  Endo/Other  diabetes, Type 2, Oral Hypoglycemic Agents  Renal/GU negative Renal ROS  negative genitourinary   Musculoskeletal  (+) Arthritis , Osteoarthritis,    Abdominal Normal abdominal exam  (+)   Peds  Hematology  (+) Blood dyscrasia, anemia ,   Anesthesia Other Findings   Reproductive/Obstetrics                             Anesthesia Physical Anesthesia Plan  ASA: II  Anesthesia Plan: Spinal   Post-op Pain Management:    Induction:   PONV Risk Score and Plan: 3 and Ondansetron, Dexamethasone, Scopolamine patch - Pre-op and Midazolam  Airway Management Planned: Natural Airway, Nasal Cannula and Simple Face Mask  Additional Equipment:   Intra-op Plan:   Post-operative Plan:   Informed Consent: I have reviewed the patients History and Physical, chart, labs and discussed the procedure including the risks, benefits and alternatives for the proposed anesthesia with the patient or authorized representative who has indicated his/her understanding and acceptance.     Plan Discussed with: CRNA and Surgeon  Anesthesia Plan Comments:         Anesthesia Quick Evaluation

## 2017-03-07 NOTE — Transfer of Care (Signed)
Immediate Anesthesia Transfer of Care Note  Patient: Fredrich Birksatricia Branscome  Procedure(s) Performed: LEFT TOTAL HIP ARTHROPLASTY ANTERIOR APPROACH (Left Hip)  Patient Location: PACU  Anesthesia Type:Spinal  Level of Consciousness: awake, alert  and oriented  Airway & Oxygen Therapy: Patient Spontanous Breathing and Patient connected to face mask oxygen  Post-op Assessment: Report given to RN and Post -op Vital signs reviewed and stable  Post vital signs: Reviewed and stable  Last Vitals:  Vitals:   03/07/17 0945  BP: (!) 146/79  Pulse: 96  Resp: 18  Temp: 37.1 C  SpO2: 100%    Last Pain:  Vitals:   03/07/17 0951  TempSrc:   PainSc: 8       Patients Stated Pain Goal: 4 (03/07/17 0951)  Complications: No apparent anesthesia complications

## 2017-03-07 NOTE — Discharge Instructions (Addendum)
INSTRUCTIONS AFTER JOINT REPLACEMENT  ° °o Remove items at home which could result in a fall. This includes throw rugs or furniture in walking pathways °o ICE to the affected joint every three hours while awake for 30 minutes at a time, for at least the first 3-5 days, and then as needed for pain and swelling.  Continue to use ice for pain and swelling. You may notice swelling that will progress down to the foot and ankle.  This is normal after surgery.  Elevate your leg when you are not up walking on it.   °o Continue to use the breathing machine you got in the hospital (incentive spirometer) which will help keep your temperature down.  It is common for your temperature to cycle up and down following surgery, especially at night when you are not up moving around and exerting yourself.  The breathing machine keeps your lungs expanded and your temperature down. ° ° °DIET:  As you were doing prior to hospitalization, we recommend a well-balanced diet. ° °DRESSING / WOUND CARE / SHOWERING ° °Keep the surgical dressing until follow up.  The dressing is water proof, so you can shower without any extra covering.  IF THE DRESSING FALLS OFF or the wound gets wet inside, change the dressing with sterile gauze.  Please use good hand washing techniques before changing the dressing.  Do not use any lotions or creams on the incision until instructed by your surgeon.   ° °ACTIVITY ° °o Increase activity slowly as tolerated, but follow the weight bearing instructions below.   °o No driving for 6 weeks or until further direction given by your physician.  You cannot drive while taking narcotics.  °o No lifting or carrying greater than 10 lbs. until further directed by your surgeon. °o Avoid periods of inactivity such as sitting longer than an hour when not asleep. This helps prevent blood clots.  °o You may return to work once you are authorized by your doctor.  ° ° ° °WEIGHT BEARING  ° °Weight bearing as tolerated with assist  device (walker, cane, etc) as directed, use it as long as suggested by your surgeon or therapist, typically at least 4-6 weeks. ° ° °EXERCISES ° °Results after joint replacement surgery are often greatly improved when you follow the exercise, range of motion and muscle strengthening exercises prescribed by your doctor. Safety measures are also important to protect the joint from further injury. Any time any of these exercises cause you to have increased pain or swelling, decrease what you are doing until you are comfortable again and then slowly increase them. If you have problems or questions, call your caregiver or physical therapist for advice.  ° °Rehabilitation is important following a joint replacement. After just a few days of immobilization, the muscles of the leg can become weakened and shrink (atrophy).  These exercises are designed to build up the tone and strength of the thigh and leg muscles and to improve motion. Often times heat used for twenty to thirty minutes before working out will loosen up your tissues and help with improving the range of motion but do not use heat for the first two weeks following surgery (sometimes heat can increase post-operative swelling).  ° °These exercises can be done on a training (exercise) mat, on the floor, on a table or on a bed. Use whatever works the best and is most comfortable for you.    Use music or television while you are exercising so that   the exercises are a pleasant break in your day. This will make your life better with the exercises acting as a break in your routine that you can look forward to.   Perform all exercises about fifteen times, three times per day or as directed.  You should exercise both the operative leg and the other leg as well. ° °Exercises include: °  °• Quad Sets - Tighten up the muscle on the front of the thigh (Quad) and hold for 5-10 seconds.   °• Straight Leg Raises - With your knee straight (if you were given a brace, keep it on),  lift the leg to 60 degrees, hold for 3 seconds, and slowly lower the leg.  Perform this exercise against resistance later as your leg gets stronger.  °• Leg Slides: Lying on your back, slowly slide your foot toward your buttocks, bending your knee up off the floor (only go as far as is comfortable). Then slowly slide your foot back down until your leg is flat on the floor again.  °• Angel Wings: Lying on your back spread your legs to the side as far apart as you can without causing discomfort.  °• Hamstring Strength:  Lying on your back, push your heel against the floor with your leg straight by tightening up the muscles of your buttocks.  Repeat, but this time bend your knee to a comfortable angle, and push your heel against the floor.  You may put a pillow under the heel to make it more comfortable if necessary.  ° °A rehabilitation program following joint replacement surgery can speed recovery and prevent re-injury in the future due to weakened muscles. Contact your doctor or a physical therapist for more information on knee rehabilitation.  ° ° °CONSTIPATION ° °Constipation is defined medically as fewer than three stools per week and severe constipation as less than one stool per week.  Even if you have a regular bowel pattern at home, your normal regimen is likely to be disrupted due to multiple reasons following surgery.  Combination of anesthesia, postoperative narcotics, change in appetite and fluid intake all can affect your bowels.  ° °YOU MUST use at least one of the following options; they are listed in order of increasing strength to get the job done.  They are all available over the counter, and you may need to use some, POSSIBLY even all of these options:   ° °Drink plenty of fluids (prune juice may be helpful) and high fiber foods °Colace 100 mg by mouth twice a day  °Senokot for constipation as directed and as needed Dulcolax (bisacodyl), take with full glass of water  °Miralax (polyethylene glycol)  once or twice a day as needed. ° °If you have tried all these things and are unable to have a bowel movement in the first 3-4 days after surgery call either your surgeon or your primary doctor.   ° °If you experience loose stools or diarrhea, hold the medications until you stool forms back up.  If your symptoms do not get better within 1 week or if they get worse, check with your doctor.  If you experience "the worst abdominal pain ever" or develop nausea or vomiting, please contact the office immediately for further recommendations for treatment. ° ° °ITCHING:  If you experience itching with your medications, try taking only a single pain pill, or even half a pain pill at a time.  You can also use Benadryl over the counter for itching or also to   help with sleep.  ° °TED HOSE STOCKINGS:  Use stockings on both legs until for at least 2 weeks or as directed by physician office. They may be removed at night for sleeping. ° °MEDICATIONS:  See your medication summary on the “After Visit Summary” that nursing will review with you.  You may have some home medications which will be placed on hold until you complete the course of blood thinner medication.  It is important for you to complete the blood thinner medication as prescribed. ° °Information on my medicine - XARELTO® (Rivaroxaban) ° °Why was Xarelto® prescribed for you? °Xarelto® was prescribed for you to reduce the risk of blood clots forming after orthopedic surgery. The medical term for these abnormal blood clots is venous thromboembolism (VTE). ° °What do you need to know about xarelto® ? °Take your Xarelto® ONCE DAILY at the same time every day. °You may take it either with or without food. ° °If you have difficulty swallowing the tablet whole, you may crush it and mix in applesauce just prior to taking your dose. ° °Take Xarelto® exactly as prescribed by your doctor and DO NOT stop taking Xarelto® without talking to the doctor who prescribed the medication.   Stopping without other VTE prevention medication to take the place of Xarelto® may increase your risk of developing a clot. ° °After discharge, you should have regular check-up appointments with your healthcare provider that is prescribing your Xarelto®.   ° °What do you do if you miss a dose? °If you miss a dose, take it as soon as you remember on the same day then continue your regularly scheduled once daily regimen the next day. Do not take two doses of Xarelto® on the same day.  ° °Important Safety Information °A possible side effect of Xarelto® is bleeding. You should call your healthcare provider right away if you experience any of the following: °? Bleeding from an injury or your nose that does not stop. °? Unusual colored urine (red or dark brown) or unusual colored stools (red or black). °? Unusual bruising for unknown reasons. °? A serious fall or if you hit your head (even if there is no bleeding). ° °Some medicines may interact with Xarelto® and might increase your risk of bleeding while on Xarelto®. To help avoid this, consult your healthcare provider or pharmacist prior to using any new prescription or non-prescription medications, including herbals, vitamins, non-steroidal anti-inflammatory drugs (NSAIDs) and supplements. ° °This website has more information on Xarelto®: www.xarelto.com. ° ° ° °PRECAUTIONS:  If you experience chest pain or shortness of breath - call 911 immediately for transfer to the hospital emergency department.  ° °If you develop a fever greater that 101 F, purulent drainage from wound, increased redness or drainage from wound, foul odor from the wound/dressing, or calf pain - CONTACT YOUR SURGEON.   °                                                °FOLLOW-UP APPOINTMENTS:  If you do not already have a post-op appointment, please call the office for an appointment to be seen by your surgeon.  Guidelines for how soon to be seen are listed in your “After Visit Summary”, but are  typically between 1-4 weeks after surgery. ° °OTHER INSTRUCTIONS:  ° °Knee Replacement:  Do not place pillow under knee,   focus on keeping the knee straight while resting.  ° °MAKE SURE YOU:  °• Understand these instructions.  °• Get help right away if you are not doing well or get worse.  ° ° °Thank you for letting us be a part of your medical care team.  It is a privilege we respect greatly.  We hope these instructions will help you stay on track for a fast and full recovery!  °  °

## 2017-03-07 NOTE — Anesthesia Procedure Notes (Signed)
Spinal  Start time: 03/07/2017 11:14 AM End time: 03/07/2017 11:17 AM Staffing Anesthesiologist: Leilani AbleHatchett, Santia Labate, MD Performed: anesthesiologist  Preanesthetic Checklist Completed: patient identified, site marked, surgical consent, pre-op evaluation, timeout performed, IV checked, risks and benefits discussed and monitors and equipment checked Spinal Block Patient position: left lateral decubitus Prep: ChloraPrep and site prepped and draped Patient monitoring: continuous pulse ox and blood pressure Approach: midline Location: L2-3 Injection technique: single-shot Needle Needle type: Pencan  Needle gauge: 24 G Needle length: 9 cm Needle insertion depth: 5 cm Assessment Sensory level: T6

## 2017-03-07 NOTE — Interval H&P Note (Signed)
History and Physical Interval Note:  03/07/2017 10:00 AM  Ariel BirksPatricia Harrington  has presented today for surgery, with the diagnosis of Left hip osteoarthritis  The various methods of treatment have been discussed with the patient and family. After consideration of risks, benefits and other options for treatment, the patient has consented to  Procedure(s) with comments: LEFT TOTAL HIP ARTHROPLASTY ANTERIOR APPROACH (Left) - 70 mins as a surgical intervention .  The patient's history has been reviewed, patient examined, no change in status, stable for surgery.  I have reviewed the patient's chart and labs.  Questions were answered to the patient's satisfaction.     Shelda PalMatthew D Safia Panzer

## 2017-03-07 NOTE — Anesthesia Postprocedure Evaluation (Signed)
Anesthesia Post Note  Patient: Ariel Harrington  Procedure(s) Performed: LEFT TOTAL HIP ARTHROPLASTY ANTERIOR APPROACH (Left Hip)     Patient location during evaluation: PACU Anesthesia Type: Spinal Level of consciousness: awake Pain management: pain level controlled Vital Signs Assessment: post-procedure vital signs reviewed and stable Respiratory status: spontaneous breathing Cardiovascular status: stable Postop Assessment: no headache, no backache, spinal receding, no apparent nausea or vomiting and patient able to bend at knees Anesthetic complications: no    Last Vitals:  Vitals:   03/07/17 0945 03/07/17 1340  BP: (!) 146/79 131/83  Pulse: 96 85  Resp: 18 (!) 23  Temp: 37.1 C 37.1 C  SpO2: 100% 100%    Last Pain:  Vitals:   03/07/17 1415  TempSrc:   PainSc: 5    Pain Goal: Patients Stated Pain Goal: 4 (03/07/17 0951)               Shomari Scicchitano JR,JOHN Susann GivensFRANKLIN

## 2017-03-08 ENCOUNTER — Encounter (HOSPITAL_COMMUNITY): Payer: Self-pay | Admitting: Orthopedic Surgery

## 2017-03-08 DIAGNOSIS — E663 Overweight: Secondary | ICD-10-CM | POA: Diagnosis present

## 2017-03-08 LAB — POCT I-STAT EG7
ACID-BASE DEFICIT: 2 mmol/L (ref 0.0–2.0)
Bicarbonate: 24.5 mmol/L (ref 20.0–28.0)
Calcium, Ion: 1.2 mmol/L (ref 1.15–1.40)
HEMATOCRIT: 24 % — AB (ref 36.0–46.0)
HEMOGLOBIN: 8.2 g/dL — AB (ref 12.0–15.0)
O2 SAT: 99 %
PCO2 VEN: 47.4 mmHg (ref 44.0–60.0)
PH VEN: 7.322 (ref 7.250–7.430)
PO2 VEN: 175 mmHg — AB (ref 32.0–45.0)
Potassium: 4 mmol/L (ref 3.5–5.1)
Sodium: 139 mmol/L (ref 135–145)
TCO2: 26 mmol/L (ref 22–32)

## 2017-03-08 LAB — CBC
HEMATOCRIT: 23.1 % — AB (ref 36.0–46.0)
Hemoglobin: 7.7 g/dL — ABNORMAL LOW (ref 12.0–15.0)
MCH: 28.8 pg (ref 26.0–34.0)
MCHC: 33.3 g/dL (ref 30.0–36.0)
MCV: 86.5 fL (ref 78.0–100.0)
PLATELETS: 197 10*3/uL (ref 150–400)
RBC: 2.67 MIL/uL — AB (ref 3.87–5.11)
RDW: 12.6 % (ref 11.5–15.5)
WBC: 8.9 10*3/uL (ref 4.0–10.5)

## 2017-03-08 LAB — BASIC METABOLIC PANEL
ANION GAP: 6 (ref 5–15)
BUN: 14 mg/dL (ref 6–20)
CO2: 28 mmol/L (ref 22–32)
Calcium: 8.3 mg/dL — ABNORMAL LOW (ref 8.9–10.3)
Chloride: 101 mmol/L (ref 101–111)
Creatinine, Ser: 0.69 mg/dL (ref 0.44–1.00)
GFR calc Af Amer: >60 mL/min (ref >60)
GFR calc non Af Amer: >60 mL/min (ref >60)
GLUCOSE: 121 mg/dL — AB (ref 65–99)
POTASSIUM: 3.7 mmol/L (ref 3.5–5.1)
Sodium: 135 mmol/L (ref 135–145)

## 2017-03-08 LAB — GLUCOSE, CAPILLARY
GLUCOSE-CAPILLARY: 135 mg/dL — AB (ref 65–99)
GLUCOSE-CAPILLARY: 217 mg/dL — AB (ref 65–99)
Glucose-Capillary: 180 mg/dL — ABNORMAL HIGH (ref 65–99)
Glucose-Capillary: 134 mg/dL — ABNORMAL HIGH (ref 65–99)

## 2017-03-08 MED ORDER — HYDROCORTISONE 0.5 % EX CREA
TOPICAL_CREAM | Freq: Three times a day (TID) | CUTANEOUS | Status: DC
  Administered 2017-03-08 – 2017-03-09 (×3): via TOPICAL
  Administered 2017-03-09: 1 via TOPICAL
  Filled 2017-03-08: qty 28.35

## 2017-03-08 NOTE — Evaluation (Signed)
Physical Therapy Evaluation Patient Details Name: Ariel Harrington MRN: [id number] DOB: 03-22-1955 Today's Date: 03/08/2017   History of Present Illness  LEFT TOTAL HIP ARTHROPLASTY ANTERIOR APPROACH. Pt is PWB  Clinical Impression  Pt is s/p THA resulting in the deficits listed below (see PT Problem List).  Pt will benefit from skilled PT to increase their independence and safety with mobility to allow discharge to the venue listed below.  Pt ambulated short distance in hallway and performed LE exercises.  Pt plans to d/c home with her sister.  Pt is PWB and will need RW upon d/c, as well as BSC.     Follow Up Recommendations Home health PT    Equipment Recommendations  Rolling walker with 5" wheels    Recommendations for Other Services       Precautions / Restrictions Precautions Precautions: Fall Restrictions Weight Bearing Restrictions: Yes LLE Weight Bearing: Partial weight bearing LLE Partial Weight Bearing Percentage or Pounds: 50%      Mobility  Bed Mobility Overal bed mobility: Needs Assistance Bed Mobility: Supine to Sit     Supine to sit: Min assist     General bed mobility comments: pt up in recliner on arrival  Transfers Overall transfer level: Needs assistance Equipment used: Rolling walker (2 wheeled) Transfers: Sit to/from Stand Sit to Stand: Min guard Stand pivot transfers: Min assist       General transfer comment: verbal cues for UE and LE positioning  Ambulation/Gait Ambulation/Gait assistance: Min guard Ambulation Distance (Feet): 50 Feet Assistive device: Rolling walker (2 wheeled) Gait Pattern/deviations: Step-to pattern;Decreased stance time - left;Trunk flexed     General Gait Details: verbal cues for sequence, RW positioning, PWB status, distance to tolerance  Stairs            Wheelchair Mobility    Modified Rankin (Stroke Patients Only)       Balance                                              Pertinent Vitals/Pain Pain Assessment: 0-10 Pain Score: 2  Pain Location: L hip area Pain Descriptors / Indicators: Sore Pain Intervention(s): Limited activity within patient's tolerance;Ice applied;Repositioned;Premedicated before session;Monitored during session    Home Living Family/patient expects to be discharged to:: Private residence Living Arrangements: Other relatives Available Help at Discharge: Family(going to her sister's home) Type of Home: House Home Access: Stairs to enter Entrance Stairs-Rails: Right;Left;Can reach both Secretary/administratorntrance Stairs-Number of Steps: 4 Home Layout: One level Home Equipment: None Additional Comments: borrowed a rollator however she is PWB    Prior Function Level of Independence: Independent               Hand Dominance        Extremity/Trunk Assessment   Upper Extremity Assessment Upper Extremity Assessment: Generalized weakness    Lower Extremity Assessment Lower Extremity Assessment: LLE deficits/detail LLE Deficits / Details: anticipated post op hip weakness especially hip flexion and abduction, able to perform ankle pumps       Communication   Communication: No difficulties  Cognition Arousal/Alertness: Awake/alert Behavior During Therapy: WFL for tasks assessed/performed Overall Cognitive Status: Within Functional Limits for tasks assessed  General Comments      Exercises Total Joint Exercises Ankle Circles/Pumps: AROM;Both;10 reps Quad Sets: AROM;Both;10 reps Towel Squeeze: AROM;Both;10 reps Hip ABduction/ADduction: AAROM;Left;10 reps Long Arc Quad: AROM;Left;Seated;10 reps Marching in Standing: AROM;Left;Seated;10 reps   Assessment/Plan    PT Assessment Patient needs continued PT services  PT Problem List Decreased strength;Decreased mobility;Decreased knowledge of precautions;Decreased knowledge of use of DME;Pain       PT Treatment  Interventions Functional mobility training;Stair training;Patient/family education;DME instruction;Therapeutic activities;Gait training;Therapeutic exercise    PT Goals (Current goals can be found in the Care Plan section)  Acute Rehab PT Goals Patient Stated Goal: home with sister PT Goal Formulation: With patient Time For Goal Achievement: 03/11/17 Potential to Achieve Goals: Good    Frequency 7X/week   Barriers to discharge        Co-evaluation               AM-PAC PT "6 Clicks" Daily Activity  Outcome Measure Difficulty turning over in bed (including adjusting bedclothes, sheets and blankets)?: A Little Difficulty moving from lying on back to sitting on the side of the bed? : Unable Difficulty sitting down on and standing up from a chair with arms (e.g., wheelchair, bedside commode, etc,.)?: Unable Help needed moving to and from a bed to chair (including a wheelchair)?: A Little Help needed walking in hospital room?: A Little Help needed climbing 3-5 steps with a railing? : A Lot 6 Click Score: 13    End of Session Equipment Utilized During Treatment: Gait belt Activity Tolerance: Patient tolerated treatment well Patient left: in chair;with call bell/phone within reach   PT Visit Diagnosis: Other abnormalities of gait and mobility (R26.89)    Time: [id number] PT Time Calculation (min) (ACUTE ONLY): 23 min   Charges:   PT Evaluation $PT Eval Low Complexity: 1 Low PT Treatments $Therapeutic Exercise: 8-22 mins   PT G Codes:        Zenovia JarredKati Angelyna Henderson, PT, DPT 03/08/2017 Pager: [phone] Maida SaleLEMYRE,KATHrine E 03/08/2017, 12:12 PM

## 2017-03-08 NOTE — Progress Notes (Signed)
     Subjective: 1 Day Post-Op Procedure(s) (LRB): LEFT TOTAL HIP ARTHROPLASTY ANTERIOR APPROACH (Left)   Patient reports pain as moderate, controlled with meds.  No events throughout the night. We discussed the surgery and PWB status. We also discussed labs and hospital course to this point.   Objective:   VITALS:   Vitals:   03/08/17 0200 03/08/17 0607  BP: 110/64 120/63  Pulse: (!) 101 93  Resp: 17 15  Temp: 98.4 F (36.9 C) 98.2 F (36.8 C)  SpO2: 100% 99%    Dorsiflexion/Plantar flexion intact Incision: dressing C/D/I No cellulitis present Compartment soft  LABS Recent Labs    03/08/17 0523  HGB 7.7*  HCT 23.1*  WBC 8.9  PLT 197    Recent Labs    03/08/17 0523  NA 135  K 3.7  BUN 14  CREATININE 0.69  GLUCOSE 121*     Assessment/Plan: 1 Day Post-Op Procedure(s) (LRB): LEFT TOTAL HIP ARTHROPLASTY ANTERIOR APPROACH (Left) Foley cath d/c'ed Advance diet Up with therapy D/C IV fluids Discharge home eventually, when ready  Overweight (BMI 25-29.9) Estimated body mass index is 29.21 kg/m as calculated from the following:   Height as of this encounter: 5\' 6"  (1.676 m).   Weight as of this encounter: 82.1 kg (181 lb). Patient also counseled that weight may inhibit the healing process Patient counseled that losing weight will help with future health issues      Anastasio AuerbachMatthew S. Dorella Laster   PAC  03/08/2017, 8:24 AM

## 2017-03-08 NOTE — Evaluation (Signed)
Occupational Therapy Evaluation Patient Details Name: Ariel Birksatricia Harper MRN: [id number] DOB: [birth year] Today's Date: 03/08/2017    History of Present Illness LEFT TOTAL HIP ARTHROPLASTY ANTERIOR APPROACH (Left) . Pt is PWB   Clinical Impression   Pt is s/p THA resulting in the deficits listed below (see OT Problem List). Pt will benefit from skilled OT to increase their safety and independence with ADL and functional mobility for ADL to facilitate discharge to venue listed below.        Follow Up Recommendations  No OT follow up    Equipment Recommendations  3 in 1 bedside commode    Recommendations for Other Services       Precautions / Restrictions        Mobility Bed Mobility Overal bed mobility: Needs Assistance Bed Mobility: Supine to Sit     Supine to sit: Min assist     General bed mobility comments: VC for technique  Transfers Overall transfer level: Needs assistance Equipment used: Rolling walker (2 wheeled) Transfers: Sit to/from UGI CorporationStand;Stand Pivot Transfers Sit to Stand: Min assist;Mod assist Stand pivot transfers: Min assist       General transfer comment: Vc for BUE and BLE hand placement        ADL either performed or assessed with clinical judgement   ADL Overall ADL's : Needs assistance/impaired Eating/Feeding: Set up;Sitting   Grooming: Set up;Sitting;Cueing for safety   Upper Body Bathing: Set up;Sitting   Lower Body Bathing: Moderate assistance;Sit to/from stand;Cueing for sequencing;Cueing for safety   Upper Body Dressing : Set up;Sitting   Lower Body Dressing: Moderate assistance;Cueing for safety;Cueing for sequencing;Sit to/from stand   Toilet Transfer: Cueing for sequencing;Stand-pivot;Cueing for safety;RW;BSC;Minimal assistance   Toileting- Clothing Manipulation and Hygiene: Minimal assistance;Sit to/from stand;Cueing for sequencing;Cueing for safety         General ADL Comments: Vc for PWB     Vision Patient  Visual Report: No change from baseline       Perception     Praxis      Pertinent Vitals/Pain Pain Assessment: 0-10 Pain Score: 2  Pain Location: L hip area Pain Descriptors / Indicators: Sore Pain Intervention(s): Limited activity within patient's tolerance     Hand Dominance     Extremity/Trunk Assessment Upper Extremity Assessment Upper Extremity Assessment: Generalized weakness           Communication Communication Communication: No difficulties   Cognition Arousal/Alertness: Awake/alert Behavior During Therapy: WFL for tasks assessed/performed Overall Cognitive Status: Within Functional Limits for tasks assessed                                                Home Living Family/patient expects to be discharged to:: Private residence Living Arrangements: Other relatives Available Help at Discharge: Family Type of Home: House       Home Layout: One level         Bathroom Toilet: Standard     Home Equipment: None          Prior Functioning/Environment Level of Independence: Independent                 OT Problem List: Decreased strength;Impaired balance (sitting and/or standing);Decreased knowledge of use of DME or AE;Decreased knowledge of precautions      OT Treatment/Interventions: Self-care/ADL training;Patient/family education;DME and/or AE instruction    OT Goals(Current goals can  be found in the care plan section) Acute Rehab OT Goals Patient Stated Goal: home with sister OT Goal Formulation: With patient Time For Goal Achievement: 03/15/17 ADL Goals Pt Will Perform Lower Body Bathing: with supervision;sit to/from stand Pt Will Perform Lower Body Dressing: with modified independence Pt Will Transfer to Toilet: with modified independence;ambulating Pt Will Perform Toileting - Clothing Manipulation and hygiene: with modified independence;sit to/from stand  OT Frequency: Min 2X/week   Barriers to D/C:                AM-PAC PT "6 Clicks" Daily Activity     Outcome Measure Help from another person eating meals?: None Help from another person taking care of personal grooming?: A Little Help from another person toileting, which includes using toliet, bedpan, or urinal?: A Little Help from another person bathing (including washing, rinsing, drying)?: A Lot Help from another person to put on and taking off regular upper body clothing?: A Little Help from another person to put on and taking off regular lower body clothing?: A Lot 6 Click Score: 17   End of Session Equipment Utilized During Treatment: Rolling walker Nurse Communication: Mobility status  Activity Tolerance: Patient tolerated treatment well Patient left: in chair;with call bell/phone within reach  OT Visit Diagnosis: Unsteadiness on feet (R26.81);Muscle weakness (generalized) (M62.81)                Time: 0950-1020 OT Time Calculation (min): 30 min Charges:  OT General Charges $OT Visit: 1 Visit OT Evaluation $OT Eval Low Complexity: 1 Low OT Treatments $Self Care/Home Management : 8-22 mins G-Codes:     Lise AuerLori Vernell Back, OT [phone]  Einar CrowEDDING, Arshiya Jakes D 03/08/2017, 11:32 AM

## 2017-03-08 NOTE — Progress Notes (Signed)
Physical Therapy Treatment Patient Details Name: Ariel Harrington MRN: [id number] DOB: 1955-08-27 Today's Date: 03/08/2017    History of Present Illness LEFT TOTAL HIP ARTHROPLASTY ANTERIOR APPROACH. Pt is PWB    PT Comments    Pt ambulated in hallway again this afternoon and left up in recliner, visiting with her nieces.   Follow Up Recommendations  Home health PT     Equipment Recommendations  Rolling walker with 5" wheels    Recommendations for Other Services       Precautions / Restrictions Precautions Precautions: Fall Restrictions Weight Bearing Restrictions: Yes LLE Weight Bearing: Partial weight bearing LLE Partial Weight Bearing Percentage or Pounds: 50%    Mobility  Bed Mobility Overal bed mobility: Needs Assistance Bed Mobility: Supine to Sit     Supine to sit: Min assist     General bed mobility comments: pt up in recliner on arrival  Transfers Overall transfer level: Needs assistance Equipment used: Rolling walker (2 wheeled) Transfers: Sit to/from Stand Sit to Stand: Min guard Stand pivot transfers: Min assist       General transfer comment: verbal cues for UE and LE positioning  Ambulation/Gait Ambulation/Gait assistance: Min guard Ambulation Distance (Feet): 60 Feet Assistive device: Rolling walker (2 wheeled) Gait Pattern/deviations: Step-to pattern;Decreased stance time - left;Trunk flexed     General Gait Details: verbal cues for sequence, RW positioning, distance to tolerance   Stairs            Wheelchair Mobility    Modified Rankin (Stroke Patients Only)       Balance                                            Cognition Arousal/Alertness: Awake/alert Behavior During Therapy: WFL for tasks assessed/performed Overall Cognitive Status: Within Functional Limits for tasks assessed                                        Exercises     General Comments        Pertinent  Vitals/Pain Pain Assessment: 0-10 Pain Score: 5  Pain Location: L hip area with ambulation Pain Descriptors / Indicators: Sore Pain Intervention(s): Limited activity within patient's tolerance;Repositioned;Monitored during session;Premedicated before session    Home Living Family/patient expects to be discharged to:: Private residence Living Arrangements: Other relatives Available Help at Discharge: Family(going to her sister's home) Type of Home: House Home Access: Stairs to enter Entrance Stairs-Rails: Right;Left;Can reach both Home Layout: One level Home Equipment: None Additional Comments: borrowed a rollator however she is PWB    Prior Function Level of Independence: Independent          PT Goals (current goals can now be found in the care plan section) Acute Rehab PT Goals Patient Stated Goal: home with sister PT Goal Formulation: With patient Time For Goal Achievement: 03/11/17 Potential to Achieve Goals: Good Progress towards PT goals: Progressing toward goals    Frequency    7X/week      PT Plan Current plan remains appropriate    Co-evaluation              AM-PAC PT "6 Clicks" Daily Activity  Outcome Measure  Difficulty turning over in bed (including adjusting bedclothes, sheets and blankets)?: A Little Difficulty moving from lying  on back to sitting on the side of the bed? : Unable Difficulty sitting down on and standing up from a chair with arms (e.g., wheelchair, bedside commode, etc,.)?: Unable Help needed moving to and from a bed to chair (including a wheelchair)?: A Little Help needed walking in hospital room?: A Little Help needed climbing 3-5 steps with a railing? : A Lot 6 Click Score: 13    End of Session Equipment Utilized During Treatment: Gait belt Activity Tolerance: Patient tolerated treatment well Patient left: in chair;with call bell/phone within reach;with family/visitor present   PT Visit Diagnosis: Other abnormalities of  gait and mobility (R26.89)     Time: [id number] PT Time Calculation (min) (ACUTE ONLY): 10 min  Charges:  $Gait Training: 8-22 mins                    G Codes:      Zenovia JarredKati Vince Ainsley, PT, DPT 03/08/2017 Pager: [phone]  Maida SaleLEMYRE,KATHrine E 03/08/2017, 2:35 PM

## 2017-03-09 LAB — GLUCOSE, CAPILLARY
GLUCOSE-CAPILLARY: 125 mg/dL — AB (ref 65–99)
Glucose-Capillary: 125 mg/dL — ABNORMAL HIGH (ref 65–99)
Glucose-Capillary: 163 mg/dL — ABNORMAL HIGH (ref 65–99)
Glucose-Capillary: 153 mg/dL — ABNORMAL HIGH (ref 65–99)
Glucose-Capillary: 124 mg/dL — ABNORMAL HIGH (ref 65–99)

## 2017-03-09 LAB — BASIC METABOLIC PANEL
Anion gap: 7 (ref 5–15)
BUN: 15 mg/dL (ref 6–20)
CHLORIDE: 99 mmol/L — AB (ref 101–111)
CO2: 28 mmol/L (ref 22–32)
CREATININE: 0.68 mg/dL (ref 0.44–1.00)
Calcium: 8.5 mg/dL — ABNORMAL LOW (ref 8.9–10.3)
GFR calc Af Amer: >60 mL/min (ref >60)
GLUCOSE: 119 mg/dL — AB (ref 65–99)
POTASSIUM: 3.7 mmol/L (ref 3.5–5.1)
Sodium: 134 mmol/L — ABNORMAL LOW (ref 135–145)

## 2017-03-09 LAB — CBC
HCT: 21.7 % — ABNORMAL LOW (ref 36.0–46.0)
Hemoglobin: 7.4 g/dL — ABNORMAL LOW (ref 12.0–15.0)
MCH: 29.7 pg (ref 26.0–34.0)
MCHC: 34.1 g/dL (ref 30.0–36.0)
MCV: 87.1 fL (ref 78.0–100.0)
PLATELETS: 189 10*3/uL (ref 150–400)
RBC: 2.49 MIL/uL — AB (ref 3.87–5.11)
RDW: 12.8 % (ref 11.5–15.5)
WBC: 11 10*3/uL — ABNORMAL HIGH (ref 4.0–10.5)

## 2017-03-09 LAB — PREPARE RBC (CROSSMATCH)

## 2017-03-09 MED ORDER — SODIUM CHLORIDE 0.9 % IV SOLN
Freq: Once | INTRAVENOUS | Status: AC
  Administered 2017-03-09: 22:00:00 via INTRAVENOUS

## 2017-03-09 MED ORDER — SODIUM CHLORIDE 0.9 % IV BOLUS (SEPSIS)
250.0000 mL | Freq: Once | INTRAVENOUS | Status: AC
  Administered 2017-03-09: 16:00:00 250 mL via INTRAVENOUS

## 2017-03-09 NOTE — Progress Notes (Signed)
Physical Therapy Treatment Patient Details Name: Ariel Harrington MRN: [id number] DOB: 11-24-1955 Today's Date: 03/09/2017    History of Present Illness LEFT TOTAL HIP ARTHROPLASTY ANTERIOR APPROACH. Pt is PWB    PT Comments    Pt ambulated in hallway and practiced safe stair technique.  Pt moving slowly however not requiring any physical assist.  Pt feels ready for d/c home.  Pt had no further questions.   Follow Up Recommendations  Home health PT     Equipment Recommendations  Rolling walker with 5" wheels    Recommendations for Other Services       Precautions / Restrictions Precautions Precautions: Fall Restrictions Weight Bearing Restrictions: Yes LLE Weight Bearing: Partial weight bearing LLE Partial Weight Bearing Percentage or Pounds: 50%    Mobility  Bed Mobility Overal bed mobility: Needs Assistance Bed Mobility: Supine to Sit;Sit to Supine     Supine to sit: Supervision Sit to supine: Supervision   General bed mobility comments: pt self assisted L LE with UEs  Transfers Overall transfer level: Needs assistance Equipment used: Rolling walker (2 wheeled) Transfers: Sit to/from Stand Sit to Stand: Min guard         General transfer comment: min/guard for safety, pt recalls safe technique without cues  Ambulation/Gait Ambulation/Gait assistance: Min guard Ambulation Distance (Feet): 40 Feet Assistive device: Rolling walker (2 wheeled) Gait Pattern/deviations: Step-to pattern;Decreased stance time - left;Trunk flexed     General Gait Details: verbal cues for sequence, RW positioning, distance to tolerance, reports more soreness today   Stairs Stairs: Yes   Stair Management: Step to pattern;Forwards;Two rails Number of Stairs: 2 General stair comments: verbal cues for sequence and safety, pt reports understanding  Wheelchair Mobility    Modified Rankin (Stroke Patients Only)       Balance                                             Cognition Arousal/Alertness: Awake/alert Behavior During Therapy: WFL for tasks assessed/performed Overall Cognitive Status: Within Functional Limits for tasks assessed                                        Exercises     General Comments        Pertinent Vitals/Pain Pain Assessment: 0-10 Pain Score: 4  Pain Location: L hip area  Pain Descriptors / Indicators: Burning;Sore Pain Intervention(s): Limited activity within patient's tolerance;Repositioned;Monitored during session    Home Living                      Prior Function            PT Goals (current goals can now be found in the care plan section) Progress towards PT goals: Progressing toward goals    Frequency    7X/week      PT Plan Current plan remains appropriate    Co-evaluation              AM-PAC PT "6 Clicks" Daily Activity  Outcome Measure  Difficulty turning over in bed (including adjusting bedclothes, sheets and blankets)?: A Little Difficulty moving from lying on back to sitting on the side of the bed? : A Little Difficulty sitting down on and standing up from a chair with  arms (Harrington.g., wheelchair, bedside commode, etc,.)?: A Little Help needed moving to and from a bed to chair (including a wheelchair)?: A Little Help needed walking in hospital room?: A Little Help needed climbing 3-5 steps with a railing? : A Little 6 Click Score: 18    End of Session   Activity Tolerance: Patient tolerated treatment well Patient left: with call bell/phone within reach;in bed   PT Visit Diagnosis: Other abnormalities of gait and mobility (R26.89)     Time: 1610-96041415-1427 PT Time Calculation (min) (ACUTE ONLY): 12 min  Charges:  $Gait Training: 8-22 mins                    G Codes:      Ariel Harrington, PT, DPT 03/09/2017 Pager: [phone]   Ariel Harrington,Ariel Harrington 03/09/2017, 4:10 PM

## 2017-03-09 NOTE — Progress Notes (Signed)
Occupational Therapy Treatment Patient Details Name: Ariel Harrington MRN: [id number] DOB: November 27, 1955 Today's Date: 03/09/2017    History of present illness LEFT TOTAL HIP ARTHROPLASTY ANTERIOR APPROACH. Pt is PWB   OT comments  Pt progressing towards acute OT goals. Focus of session was toilet transfer and toileting tasks. Also discussed technique for LB ADLs. Pt needed several brief standing rest breaks ambulating to and from bathroom. Pt with increased pain at end of session, reporting 6/10 in the beginning of session, 8/10 (FACES) at end of session in bed. D/c plan remains appropriate.   Follow Up Recommendations  No OT follow up    Equipment Recommendations  3 in 1 bedside commode    Recommendations for Other Services      Precautions / Restrictions Precautions Precautions: Fall Restrictions Weight Bearing Restrictions: Yes LLE Weight Bearing: Partial weight bearing LLE Partial Weight Bearing Percentage or Pounds: 50%       Mobility Bed Mobility Overal bed mobility: Needs Assistance Bed Mobility: Sit to Supine       Sit to supine: Min assist   General bed mobility comments: assist to advance LLE up onto bed. Pt using BUE to position LLE once in bed. Pt reporting increased pain after bed mobility, "I won't get back in the bed on that side again" (Entered bed towards right side.)  Transfers Overall transfer level: Needs assistance Equipment used: Rolling walker (2 wheeled) Transfers: Sit to/from Stand Sit to Stand: Min guard         General transfer comment: cues for technique with rw.    Balance Overall balance assessment: Needs assistance         Standing balance support: Bilateral upper extremity supported Standing balance-Leahy Scale: Poor                             ADL either performed or assessed with clinical judgement   ADL Overall ADL's : Needs assistance/impaired                         Toilet Transfer:  Ambulation;Minimal assistance;RW Toilet Transfer Details (indicate cue type and reason): multiple but brief standing rest breaks. cues for technique and hand placement with rw.  Toileting- Clothing Manipulation and Hygiene: Minimal assistance;Sit to/from stand;Cueing for sequencing;Min guard       Functional mobility during ADLs: Minimal assistance;Rolling walker;Min guard General ADL Comments: Pt completed functional mobility from recliner to bathroom with standing rest breaks incorporated as needed. Pt reports chronic issues with R knee and R>L shoulders impacting her mobility with PWB status. Pt compeleted pericare in sitting position. Assist to steady. Would likely need min to mod A for posterior pericare. Pt able to reach down LLE to a point that she should be able to complete LB ADLs without assist or AD, did discuss both just in case. Educated on general LB dressing technique.      Vision       Perception     Praxis      Cognition Arousal/Alertness: Awake/alert Behavior During Therapy: WFL for tasks assessed/performed Overall Cognitive Status: Within Functional Limits for tasks assessed                                          Exercises     Shoulder Instructions  General Comments      Pertinent Vitals/ Pain       Pain Assessment: Faces Pain Score: 6  Faces Pain Scale: Hurts whole lot Pain Location: L hip area with ambulation, from 6 to likely 8 at end of session Pain Descriptors / Indicators: Burning;Sore Pain Intervention(s): Limited activity within patient's tolerance;Monitored during session;Repositioned;Patient requesting pain meds-RN notified;Ice applied  Home Living                                          Prior Functioning/Environment              Frequency  Min 2X/week        Progress Toward Goals  OT Goals(current goals can now be found in the care plan section)  Progress towards OT goals: Progressing  toward goals  Acute Rehab OT Goals Patient Stated Goal: home with sister OT Goal Formulation: With patient Time For Goal Achievement: 03/15/17 ADL Goals Pt Will Perform Lower Body Bathing: with supervision;sit to/from stand Pt Will Perform Lower Body Dressing: with modified independence Pt Will Transfer to Toilet: with modified independence;ambulating Pt Will Perform Toileting - Clothing Manipulation and hygiene: with modified independence;sit to/from stand  Plan Discharge plan remains appropriate    Co-evaluation                 AM-PAC PT "6 Clicks" Daily Activity     Outcome Measure   Help from another person eating meals?: None Help from another person taking care of personal grooming?: A Little Help from another person toileting, which includes using toliet, bedpan, or urinal?: A Little Help from another person bathing (including washing, rinsing, drying)?: A Lot Help from another person to put on and taking off regular upper body clothing?: A Little Help from another person to put on and taking off regular lower body clothing?: A Lot 6 Click Score: 17    End of Session Equipment Utilized During Treatment: Rolling walker  OT Visit Diagnosis: Unsteadiness on feet (R26.81);Muscle weakness (generalized) (M62.81)   Activity Tolerance Patient limited by pain   Patient Left in bed;with call bell/phone within reach;with SCD's reapplied   Nurse Communication Patient requests pain meds        Time: 1203-1230 OT Time Calculation (min): 27 min  Charges: OT General Charges $OT Visit: 1 Visit OT Treatments $Self Care/Home Management : 23-37 mins     Pilar GrammesMathews, Danaisha Celli H 03/09/2017, 12:49 PM

## 2017-03-09 NOTE — Progress Notes (Signed)
     Subjective: 2 Days Post-Op Procedure(s) (LRB): LEFT TOTAL HIP ARTHROPLASTY ANTERIOR APPROACH (Left)   Patient reports pain as mild, pain controlled. No events throughout the night.  At this time she feels very fatigued and "run down."  Patient's HR is elevate and BP decreased.  Due to the symptoms we have discussed giving blood to help with anemia.  Objective:   VITALS:   Vitals:   03/09/17 0949 03/09/17 1500  BP: (!) 104/56 (!) 95/57  Pulse: (!) 113 (!) 112  Resp:  16  Temp:  99 F (37.2 C)  SpO2:  100%    Dorsiflexion/Plantar flexion intact Incision: dressing C/D/I No cellulitis present Compartment soft  LABS Recent Labs    03/07/17 1308 03/08/17 0523 03/09/17 0515  HGB 8.2* 7.7* 7.4*  HCT 24.0* 23.1* 21.7*  WBC  --  8.9 11.0*  PLT  --  197 189    Recent Labs    03/07/17 1308 03/08/17 0523 03/09/17 0515  NA 139 135 134*  K 4.0 3.7 3.7  BUN  --  14 15  CREATININE  --  0.69 0.68  GLUCOSE  --  121* 119*     Assessment/Plan: 2 Days Post-Op Procedure(s) (LRB): LEFT TOTAL HIP ARTHROPLASTY ANTERIOR APPROACH (Left) Stressed PWB Up with therapy Discharge home eventually, when ready  ABLA  Ordered 2 units of blood Treated with iron and will observe  Overweight (BMI 25-29.9) Estimated body mass index is 29.21 kg/m as calculated from the following:   Height as of this encounter: 5\' 6"  (1.676 m).   Weight as of this encounter: 82.1 kg (181 lb). Patient also counseled that weight may inhibit the healing process Patient counseled that losing weight will help with future health issues      Anastasio AuerbachMatthew S. Denelda Akerley   PAC  03/09/2017, 4:33 PM

## 2017-03-09 NOTE — Progress Notes (Signed)
Physical Therapy Treatment Patient Details Name: Ariel Harrington MRN: [id number] DOB: 05/20/1955 Today's Date: 03/09/2017    History of Present Illness LEFT TOTAL HIP ARTHROPLASTY ANTERIOR APPROACH. Pt is PWB    PT Comments    Pt ambulated short distance in hallway and performed LE exercises.  Pt to practice steps this afternoon prior to d/c.  Follow Up Recommendations  Home health PT     Equipment Recommendations  Rolling walker with 5" wheels    Recommendations for Other Services       Precautions / Restrictions Precautions Precautions: Fall Restrictions Weight Bearing Restrictions: Yes LLE Weight Bearing: Partial weight bearing LLE Partial Weight Bearing Percentage or Pounds: 50%    Mobility  Bed Mobility Overal bed mobility: Needs Assistance Bed Mobility: Sit to Supine       Sit to supine: Min assist   General bed mobility comments: pt up in recliner on arrival  Transfers Overall transfer level: Needs assistance Equipment used: Rolling walker (2 wheeled) Transfers: Sit to/from Stand Sit to Stand: Min guard         General transfer comment: verbal cues for safe technique  Ambulation/Gait Ambulation/Gait assistance: Min guard Ambulation Distance (Feet): 40 Feet Assistive device: Rolling walker (2 wheeled) Gait Pattern/deviations: Step-to pattern;Decreased stance time - left;Trunk flexed     General Gait Details: verbal cues for sequence, RW positioning, distance to tolerance, reports more soreness today   Stairs            Wheelchair Mobility    Modified Rankin (Stroke Patients Only)       Balance Overall balance assessment: Needs assistance         Standing balance support: Bilateral upper extremity supported Standing balance-Leahy Scale: Poor                              Cognition Arousal/Alertness: Awake/alert Behavior During Therapy: WFL for tasks assessed/performed Overall Cognitive Status: Within  Functional Limits for tasks assessed                                        Exercises Total Joint Exercises Ankle Circles/Pumps: AROM;Both;10 reps Quad Sets: AROM;Both;10 reps Towel Squeeze: AROM;Both;10 reps Heel Slides: AAROM;Left;10 reps Hip ABduction/ADduction: AAROM;Left;10 reps Long Arc Quad: AROM;Left;Seated;10 reps Marching in Standing: AROM;Left;Seated;10 reps    General Comments        Pertinent Vitals/Pain Pain Assessment: 0-10 Pain Score: 4  Faces Pain Scale: Hurts whole lot Pain Location: L hip area  Pain Descriptors / Indicators: Burning;Sore Pain Intervention(s): Limited activity within patient's tolerance;Repositioned;Monitored during session    Home Living                      Prior Function            PT Goals (current goals can now be found in the care plan section) Acute Rehab PT Goals Patient Stated Goal: home with sister Progress towards PT goals: Progressing toward goals    Frequency    7X/week      PT Plan Current plan remains appropriate    Co-evaluation              AM-PAC PT "6 Clicks" Daily Activity  Outcome Measure  Difficulty turning over in bed (including adjusting bedclothes, sheets and blankets)?: A Little Difficulty moving from lying on back to  sitting on the side of the bed? : Unable Difficulty sitting down on and standing up from a chair with arms (e.g., wheelchair, bedside commode, etc,.)?: Unable Help needed moving to and from a bed to chair (including a wheelchair)?: A Little Help needed walking in hospital room?: A Little Help needed climbing 3-5 steps with a railing? : A Little 6 Click Score: 14    End of Session   Activity Tolerance: Patient tolerated treatment well Patient left: in chair;with call bell/phone within reach   PT Visit Diagnosis: Other abnormalities of gait and mobility (R26.89)     Time: [id number] PT Time Calculation (min) (ACUTE ONLY): 21 min  Charges:   $Therapeutic Exercise: 8-22 mins                    G Codes:       Zenovia JarredKati Fionna Merriott, PT, DPT 03/09/2017 Pager: [phone]  Maida SaleLEMYRE,KATHrine E 03/09/2017, 1:01 PM

## 2017-03-09 NOTE — Progress Notes (Signed)
2147: Left message with on-call answering service regarding patient's Pre-transfusion temp: 102.8;  HR 125; Return-call received @2151  from Dr. Aundria Rudogers with orders to proceed with transfusion, encourage patient to use incentive spirometer and administer PRN APAP.

## 2017-03-10 LAB — CBC
HCT: 29.8 % — ABNORMAL LOW (ref 36.0–46.0)
Hemoglobin: 10.1 g/dL — ABNORMAL LOW (ref 12.0–15.0)
MCH: 29 pg (ref 26.0–34.0)
MCHC: 33.9 g/dL (ref 30.0–36.0)
MCV: 85.6 fL (ref 78.0–100.0)
PLATELETS: 197 10*3/uL (ref 150–400)
RBC: 3.48 MIL/uL — ABNORMAL LOW (ref 3.87–5.11)
RDW: 13.8 % (ref 11.5–15.5)
WBC: 12.3 10*3/uL — ABNORMAL HIGH (ref 4.0–10.5)

## 2017-03-10 LAB — GLUCOSE, CAPILLARY
Glucose-Capillary: 140 mg/dL — ABNORMAL HIGH (ref 65–99)
Glucose-Capillary: 189 mg/dL — ABNORMAL HIGH (ref 65–99)

## 2017-03-10 NOTE — Progress Notes (Addendum)
     Subjective: 3 Days Post-Op Procedure(s) (LRB): LEFT TOTAL HIP ARTHROPLASTY ANTERIOR APPROACH (Left)   Patient reports pain as mild, pain controlled. She did have orders for blood late yesterday, but just finished this morning.  No new labs yet today, as it's too close to the time of finishing the blood.  Labs ordered.  She states that she is doing better this morning.  Ready to be discharged home once we find the labs values are good. Dr. Charlann Boxerlin again stressed the importance of maintaining PWB.  Objective:   VITALS:   Vitals:   03/10/17 0413 03/10/17 0700  BP: (!) 153/66 113/62  Pulse: (!) 111 (!) 103  Resp: 18 18  Temp: 99.5 F (37.5 C) 98.6 F (37 C)  SpO2: 100% 96%    Dorsiflexion/Plantar flexion intact Incision: dressing C/D/I No cellulitis present Compartment soft  LABS Recent Labs    03/08/17 0523 03/09/17 0515 03/10/17 0936  HGB 7.7* 7.4* 10.1*  HCT 23.1* 21.7* 29.8*  WBC 8.9 11.0* 12.3*  PLT 197 189 197    Recent Labs    03/07/17 1308 03/08/17 0523 03/09/17 0515  NA 139 135 134*  K 4.0 3.7 3.7  BUN  --  14 15  CREATININE  --  0.69 0.68  GLUCOSE  --  121* 119*     Assessment/Plan: 3 Days Post-Op Procedure(s) (LRB): LEFT TOTAL HIP ARTHROPLASTY ANTERIOR APPROACH (Left) Up with therapy Discharge home Follow up in 2 weeks at [facility]. Follow up with OLIN,Vickee Mormino D in 2 weeks.  Contact information:  [facility] [address], [address] [place] [id number] [phone]        Anastasio AuerbachMatthew S. Karlen Barbar   PAC  03/10/2017, 7:55 AM

## 2017-03-10 NOTE — Progress Notes (Signed)
Physical Therapy Treatment Patient Details Name: Ariel Harrington MRN: [id number] DOB: Apr 20, 1955 Today's Date: 03/10/2017    History of Present Illness LEFT TOTAL HIP ARTHROPLASTY ANTERIOR APPROACH. Pt is PWB    PT Comments    POD # 3 post op anemia/transfusion  Assisted OOB to amb to bathroom then in hallway an increased distance.  Pt feels better but still "tired".  Practiced 2 steps backward with walker due to 50% WBing restriction.  Practiced twice.   Pt has met goals to D/C to home one session.   Follow Up Recommendations  Home health PT     Equipment Recommendations  Rolling walker with 5" wheels    Recommendations for Other Services       Precautions / Restrictions Precautions Precautions: Fall Restrictions Weight Bearing Restrictions: Yes LLE Weight Bearing: Partial weight bearing LLE Partial Weight Bearing Percentage or Pounds: 50%    Mobility  Bed Mobility Overal bed mobility: Needs Assistance Bed Mobility: Supine to Sit           General bed mobility comments: pt self assisted L LE with UEs  Transfers Overall transfer level: Needs assistance Equipment used: Rolling walker (2 wheeled) Transfers: Sit to/from [facility] Sit to Stand: Supervision;Min guard Stand pivot transfers: Supervision;Min guard       General transfer comment: one VC for safety with turn completion  Ambulation/Gait Ambulation/Gait assistance: Supervision;Min guard Ambulation Distance (Feet): 42 Feet Assistive device: Rolling walker (2 wheeled) Gait Pattern/deviations: Step-to pattern;Decreased stance time - left;Trunk flexed Gait velocity: decreased   General Gait Details: verbal cues for sequence, RW positioning, distance to tolerance, reports more pain today    Stairs Stairs: Yes   Stair Management: Step to pattern;Backwards;With walker Number of Stairs: 2 General stair comments: 50% VC's on backward approach with walker due to 50% WBing      performed twice   Wheelchair Mobility    Modified Rankin (Stroke Patients Only)       Balance                                            Cognition Arousal/Alertness: Awake/alert Behavior During Therapy: WFL for tasks assessed/performed Overall Cognitive Status: Within Functional Limits for tasks assessed                                        Exercises      General Comments        Pertinent Vitals/Pain Pain Assessment: No/denies pain Pain Score: 8  Pain Location: L hip area  Pain Descriptors / Indicators: Aching;Operative site guarding;Tender Pain Intervention(s): Monitored during session;Premedicated before session;Ice applied;Repositioned    Home Living                      Prior Function            PT Goals (current goals can now be found in the care plan section) Progress towards PT goals: Progressing toward goals    Frequency    7X/week      PT Plan Current plan remains appropriate    Co-evaluation              AM-PAC PT "6 Clicks" Daily Activity  Outcome Measure  Difficulty turning over in bed (including adjusting bedclothes, sheets  and blankets)?: A Little Difficulty moving from lying on back to sitting on the side of the bed? : A Little Difficulty sitting down on and standing up from a chair with arms (e.g., wheelchair, bedside commode, etc,.)?: A Little Help needed moving to and from a bed to chair (including a wheelchair)?: A Little Help needed walking in hospital room?: A Little Help needed climbing 3-5 steps with a railing? : A Little 6 Click Score: 18    End of Session Equipment Utilized During Treatment: Gait belt Activity Tolerance: Patient tolerated treatment well Patient left: with call bell/phone within reach;in bed Nurse Communication: (pt ready for D/C to home) PT Visit Diagnosis: Other abnormalities of gait and mobility (R26.89)     Time: 8841-6606 PT Time Calculation  (min) (ACUTE ONLY): 28 min  Charges:  $Gait Training: 8-22 mins $Therapeutic Activity: 8-22 mins                    G Codes:       {Emmalyne Giacomo  PTA [facility]  Acute  Rehab Pager      [phone]

## 2017-03-10 NOTE — Progress Notes (Signed)
Occupational Therapy Treatment Patient Details Name: Ariel Birksatricia Schappell MRN: [id number] DOB: 11-13-1955 Today's Date: 03/10/2017    History of present illness LEFT TOTAL HIP ARTHROPLASTY ANTERIOR APPROACH. Pt is PWB   OT comments  All education completed this session  Follow Up Recommendations  No OT follow up    Equipment Recommendations  3 in 1 bedside commode    Recommendations for Other Services      Precautions / Restrictions Precautions Precautions: Fall Restrictions LLE Weight Bearing: Partial weight bearing LLE Partial Weight Bearing Percentage or Pounds: 50       Mobility Bed Mobility         Supine to sit: Supervision        Transfers   Equipment used: Rolling walker (2 wheeled)   Sit to Stand: Min guard         General transfer comment: for safety    Balance                                           ADL either performed or assessed with clinical judgement   ADL       Grooming: Oral care;Supervision/safety;Standing                   Toilet Transfer: Min guard;Ambulation;BSC;RW   Toileting- ArchitectClothing Manipulation and Hygiene: Min guard;Sit to/from stand   Tub/ Shower Transfer: Walk-in shower;Min guard;Ambulation     General ADL Comments: simulated shower transfer as her ledge is shorter than the one in the hospital.      Vision       Perception     Praxis      Cognition Arousal/Alertness: Awake/alert Behavior During Therapy: WFL for tasks assessed/performed Overall Cognitive Status: Within Functional Limits for tasks assessed                                          Exercises     Shoulder Instructions       General Comments      Pertinent Vitals/ Pain       Pain Score: 5  Pain Location: L hip area  Pain Descriptors / Indicators: Aching Pain Intervention(s): Limited activity within patient's tolerance;Monitored during session;Premedicated before  session;Repositioned(removed ice)  Home Living                                          Prior Functioning/Environment              Frequency           Progress Toward Goals  OT Goals(current goals can now be found in the care plan section)  Progress towards OT goals: Progressing toward goals     Plan      Co-evaluation                 AM-PAC PT "6 Clicks" Daily Activity     Outcome Measure   Help from another person eating meals?: None Help from another person taking care of personal grooming?: A Little Help from another person toileting, which includes using toliet, bedpan, or urinal?: A Little Help from another person bathing (including washing, rinsing, drying)?: A Little Help from another  person to put on and taking off regular upper body clothing?: A Little Help from another person to put on and taking off regular lower body clothing?: A Little 6 Click Score: 19    End of Session    OT Visit Diagnosis: Pain Pain - Right/Left: Left Pain - part of body: Hip   Activity Tolerance Patient tolerated treatment well   Patient Left with call bell/phone within reach;in chair   Nurse Communication          Time: [id number] OT Time Calculation (min): 18 min  Charges: OT General Charges $OT Visit: 1 Visit OT Treatments $Self Care/Home Management : 8-22 mins  Ariel Harrington, OTR/L [phone] 03/10/2017   Ariel Harrington 03/10/2017, 8:27 AM

## 2017-03-10 NOTE — Care Management Important Message (Signed)
Important Message  Patient Details  Name: Ariel Harrington MRN: [id number] Date of Birth: 08-29-1955   Medicare Important Message Given:  Yes    Caren MacadamFuller, Keionte Swicegood 03/10/2017, 11:35 AMImportant Message  Patient Details  Name: Ariel Harrington MRN: [id number] Date of Birth: 08-29-1955   Medicare Important Message Given:  Yes    Caren MacadamFuller, Javarius Tsosie 03/10/2017, 11:35 AM

## 2017-03-10 NOTE — Progress Notes (Signed)
Discharge plan:  Pt states she is doing HEP and needs a RW and 3in1 for home. Orders received and [facility] rep alerted of orders. Sandford Crazeora Shalom Mcguiness RN,BSN,NCM [phone]

## 2017-03-11 LAB — TYPE AND SCREEN
ABO/RH(D): AB POS
ANTIBODY SCREEN: NEGATIVE
UNIT DIVISION: 0
Unit division: 0

## 2017-03-11 LAB — BPAM RBC
BLOOD PRODUCT EXPIRATION DATE: 201901122359
Blood Product Expiration Date: 201901122359
ISSUE DATE / TIME: 201812202300
ISSUE DATE / TIME: 201812210304
Unit Type and Rh: 6200
Unit Type and Rh: 6200

## 2017-03-16 NOTE — Discharge Summary (Signed)
Physician Discharge Summary  Patient ID: Ariel Birksatricia Monteith MRN: [id number] DOB/AGE: 63-Mar-1957 63 y.o.  Admit date: 03/07/2017 Discharge date: 03/10/2017   Procedures:  Procedure(s) (LRB): LEFT TOTAL HIP ARTHROPLASTY ANTERIOR APPROACH (Left)  Attending Physician:  Dr. Durene RomansMatthew Olin   Admission Diagnoses:   Left hip primary OA / pain  Discharge Diagnoses:  Principal Problem:   S/P left THA, AA Active Problems:   Overweight (BMI 25.0-29.9)  Past Medical History:  Diagnosis Date  . Anemia   . Arthritis   . Asthma   . Depression   . Diabetes mellitus without complication (HCC)   . GERD (gastroesophageal reflux disease)   . Hypertension     HPI:    Ariel Harrington, 61 y.o. female, has a history of pain and functional disability in the left hip(s) due to arthritis and patient has failed non-surgical conservative treatments for greater than 12 weeks to include  NSAID's and/or analgesics, use of assistive devices and activity modification.  Onset of symptoms was abrupt starting 1+ years ago with rapidlly worsening course since that time.The patient noted no past surgery on the left hip(s).  Patient currently rates pain in the left hip at 10 out of 10 with activity. Patient has night pain, worsening of pain with activity and weight bearing, trendelenberg gait, pain that interfers with activities of daily living and pain with passive range of motion. Patient has evidence of periarticular osteophytes, joint space narrowing and fracture by imaging studies. This condition presents safety issues increasing the risk of falls. There is no current active infection.  Risks, benefits and expectations were discussed with the patient.  Risks including but not limited to the risk of anesthesia, blood clots, nerve damage, blood vessel damage, failure of the prosthesis, infection and up to and including death.  Patient understand the risks, benefits and expectations and wishes to proceed with  surgery.   PCP: Legrand PittsShough, William, PA   Discharged Condition: good  Hospital Course:  Patient underwent the above stated procedure on 03/07/2017. Patient tolerated the procedure well and brought to the recovery room in good condition and subsequently to the floor.  POD #1 BP: 120/63 ; Pulse: 93 ; Temp: 98.2 F (36.8 C) ; Resp: 15 Patient reports pain as moderate, controlled with meds.  No events throughout the night. We discussed the surgery and PWB status. We also discussed labs and hospital course to this point.  Dorsiflexion/plantar flexion intact, incision: dressing C/D/I, no cellulitis present and compartment soft.   LABS  Basename    HGB     7.7  HCT     23.1   POD #2  BP: 95/57 ; Pulse: 112 ; Temp: 99 F (37.2 C) ; Resp: 16 Patient reports pain as mild, pain controlled. No events throughout the night.  At this time she feels very fatigued and "run down."  Patient's HR is elevate and BP decreased.  Due to the symptoms we have discussed giving blood to help with anemia. Dorsiflexion/plantar flexion intact, incision: dressing C/D/I, no cellulitis present and compartment soft.   LABS  Basename    HGB     7.4  HCT     21.7   POD #3  BP: 113/62 ; Pulse: 103 ; Temp: 98.6 F (37 C) ; Resp: 18 Patient reports pain as mild, pain controlled. She did have orders for blood late yesterday, but just finished this morning.  No new labs yet today, as it's too close to the time of finishing the blood.  Labs ordered.  She states that she is doing better this morning.  Ready to be discharged home once we find the labs values are good. Dr. Charlann Boxerlin again stressed the importance of maintaining PWB. Dorsiflexion/plantar flexion intact, incision: dressing C/D/I, no cellulitis present and compartment soft.   LABS  Basename    HGB     10.1  HCT     29.8    Discharge Exam: General appearance: alert, cooperative and no distress Extremities: Homans sign is negative, no sign of DVT, no edema, redness or  tenderness in the calves or thighs and no ulcers, gangrene or trophic changes  Disposition: Home with follow up in 2 weeks   Follow-up Information    Durene Romanslin, Deleah Tison, MD. Schedule an appointment as soon as possible for a visit in 2 week(s).   Specialty:  Orthopedic Surgery Contact information: [address] [place] [id number] [phone]           Discharge Instructions    Call MD / Call 911   Complete by:  As directed    If you experience chest pain or shortness of breath, CALL 911 and be transported to the hospital emergency room.  If you develope a fever above 101 F, pus (white drainage) or increased drainage or redness at the wound, or calf pain, call your surgeon's office.   Change dressing   Complete by:  As directed    Maintain surgical dressing until follow up in the clinic. If the edges start to pull up, may reinforce with tape. If the dressing is no longer working, may remove and cover with gauze and tape, but must keep the area dry and clean.  Call with any questions or concerns.   Constipation Prevention   Complete by:  As directed    Drink plenty of fluids.  Prune juice may be helpful.  You may use a stool softener, such as Colace (over the counter) 100 mg twice a day.  Use MiraLax (over the counter) for constipation as needed.   Diet - low sodium heart healthy   Complete by:  As directed    Discharge instructions   Complete by:  As directed    Maintain surgical dressing until follow up in the clinic. If the edges start to pull up, may reinforce with tape. If the dressing is no longer working, may remove and cover with gauze and tape, but must keep the area dry and clean.  [facility]. Call with any questions or concerns.   Increase activity slowly as tolerated   Complete by:  As directed    Partial weight bearing with assist device as directed.  50% left leg.   TED hose   Complete by:  As directed    Use  stockings (TED hose) for 2 weeks on both leg(s).  You may remove them at night for sleeping.      Allergies as of 03/10/2017      Reactions   Bactrim [sulfamethoxazole-trimethoprim] Shortness Of Breath   Aspirin Other (See Comments)   Irritates stomach   Codeine Other (See Comments)   Sweat and get really hot   Gabapentin Itching   Ibuprofen Other (See Comments)   Irritates stomach   Lyrica [pregabalin] Other (See Comments)   Blister      Medication List    STOP taking these medications   oxyCODONE-acetaminophen 7.5-325 MG tablet Commonly known as:  PERCOCET     TAKE these medications  acetaminophen 500 MG tablet Commonly known as:  TYLENOL Take 2 tablets (1,000 mg total) by mouth every 8 (eight) hours.   albuterol 108 (90 Base) MCG/ACT inhaler Commonly known as:  PROVENTIL HFA;VENTOLIN HFA Inhale 2 puffs into the lungs every 6 (six) hours as needed for wheezing or shortness of breath.   docusate sodium 100 MG capsule Commonly known as:  COLACE Take 1 capsule (100 mg total) by mouth 2 (two) times daily.   DULoxetine 60 MG capsule Commonly known as:  CYMBALTA Take 60 mg by mouth daily.   ferrous sulfate 325 (65 FE) MG tablet Commonly known as:  FERROUSUL Take 1 tablet (325 mg total) by mouth 3 (three) times daily with meals.   fluticasone 50 MCG/ACT nasal spray Commonly known as:  FLONASE Place 1-2 sprays into both nostrils 2 (two) times daily as needed for allergies or rhinitis.   hydrocortisone butyrate 0.1 % Crea cream Commonly known as:  LUCOID Apply 1 application topically 3 (three) times daily.   metFORMIN 1000 MG tablet Commonly known as:  GLUCOPHAGE Take 1,000 mg by mouth 2 (two) times daily with a meal.   methocarbamol 500 MG tablet Commonly known as:  ROBAXIN Take 1 tablet (500 mg total) by mouth every 6 (six) hours as needed for muscle spasms.   NON FORMULARY Apply 1 application topically 3 (three) times daily.  Ketoprofen10%/Baclofen5%/Bupivicane1%   olmesartan-hydrochlorothiazide 40-25 MG tablet Commonly known as:  BENICAR HCT Take 1 tablet by mouth daily.   oxyCODONE 5 MG immediate release tablet Commonly known as:  Oxy IR/ROXICODONE Take 1-2 tablets (5-10 mg total) by mouth every 4 (four) hours as needed for moderate pain or severe pain.   pantoprazole 40 MG tablet Commonly known as:  PROTONIX Take 40 mg by mouth daily.   polyethylene glycol packet Commonly known as:  MIRALAX / GLYCOLAX Take 17 g by mouth 2 (two) times daily.   rivaroxaban 10 MG Tabs tablet Commonly known as:  XARELTO Take 1 tablet (10 mg total) by mouth daily.   sitaGLIPtin 50 MG tablet Commonly known as:  JANUVIA Take 50 mg by mouth daily.            Discharge Care Instructions  (From admission, onward)        Start     Ordered   03/08/17 0000  Change dressing    Comments:  Maintain surgical dressing until follow up in the clinic. If the edges start to pull up, may reinforce with tape. If the dressing is no longer working, may remove and cover with gauze and tape, but must keep the area dry and clean.  Call with any questions or concerns.   03/08/17 0827       Signed: Anastasio Auerbach. Khalen Styer   PA-C  03/16/2017, 12:35 PM

## 2019-02-06 ENCOUNTER — Other Ambulatory Visit (HOSPITAL_COMMUNITY): Payer: Self-pay | Admitting: Orthopedic Surgery

## 2019-02-06 ENCOUNTER — Other Ambulatory Visit: Payer: Self-pay | Admitting: Orthopedic Surgery

## 2019-02-06 DIAGNOSIS — Z96642 Presence of left artificial hip joint: Secondary | ICD-10-CM

## 2019-02-11 ENCOUNTER — Encounter (HOSPITAL_COMMUNITY)
Admission: RE | Admit: 2019-02-11 | Discharge: 2019-02-11 | Disposition: A | Discharge status: Home / Self Care | Payer: Medicare HMO | Source: Ambulatory Visit | Attending: Orthopedic Surgery | Admitting: Orthopedic Surgery

## 2019-02-11 ENCOUNTER — Other Ambulatory Visit: Payer: Self-pay

## 2019-02-11 ENCOUNTER — Ambulatory Visit (HOSPITAL_COMMUNITY)
Admission: RE | Admit: 2019-02-11 | Discharge: 2019-02-11 | Disposition: A | Discharge status: Home / Self Care | Payer: Medicare HMO | Source: Ambulatory Visit | Attending: Orthopedic Surgery | Admitting: Orthopedic Surgery

## 2019-02-11 DIAGNOSIS — Z96642 Presence of left artificial hip joint: Secondary | ICD-10-CM | POA: Insufficient documentation

## 2019-02-11 MED ORDER — TECHNETIUM TC 99M MEDRONATE IV KIT
21.4000 | PACK | Freq: Once | INTRAVENOUS | Status: AC | PRN
  Administered 2019-02-11: 21.4 via INTRAVENOUS

## 2019-09-12 NOTE — Patient Instructions (Addendum)
DUE TO COVID-19 ONLY ONE VISITOR IS ALLOWED TO COME WITH YOU AND STAY IN THE WAITING ROOM ONLY DURING PRE OP AND PROCEDURE DAY OF SURGERY. Two VISITOR MAY VISIT WITH YOU AFTER SURGERY IN YOUR PRIVATE ROOM DURING VISITING HOURS ONLY! 10-a-8p  YOU NEED TO HAVE A COVID 19 TEST ON___7-6-21____ @___1 :30 pm____, THIS TEST MUST BE DONE BEFORE SURGERY, COME  [address], [place] , [address].  (Veteran) ONCE YOUR COVID TEST IS COMPLETED, PLEASE BEGIN THE QUARANTINE INSTRUCTIONS AS OUTLINED IN YOUR HANDOUT.                Arneta Mahmood  09/12/2019   Your procedure is scheduled on: 09-26-19   Report to [facility] Main  Entrance   Report to admitting at      0735 AM     Call this number if you have problems the morning of surgery [phone]    Remember: NO SOLID FOOD AFTER MIDNIGHT THE NIGHT PRIOR TO SURGERY. NOTHING BY MOUTH EXCEPT CLEAR LIQUIDS UNTIL   0700 am . PLEASE FINISH G2  DRINK PER SURGEON ORDER  WHICH NEEDS TO BE COMPLETED AT     0700 am then nothing by mouth.      CLEAR LIQUID DIET   Foods Allowed                                                                     Foods Excluded  Coffee and tea, regular and decaf                             liquids that you cannot  Plain Jell-O any favor except red or purple                                           see through such as: Fruit ices (not with fruit pulp)                                     milk, soups, orange juice  Iced Popsicles                                    All solid food Carbonated beverages, regular and diet                                    Cranberry, grape and apple juices Sports drinks like Gatorade Lightly seasoned clear broth or consume(fat free) Sugar, honey syrup   _____________________________________________________________________    BRUSH YOUR TEETH MORNING OF SURGERY AND RINSE YOUR MOUTH OUT, NO CHEWING GUM CANDY OR MINTS.     Take these medicines the morning  of surgery with A SIP OF WATER: protonix, livalo, cymbalta, carvedilol, inhaler and bring it with you  DO NOT TAKE ANY DIABETIC MEDICATIONS DAY OF YOUR SURGERY  You may not have any metal on your body including hair pins and              piercings  Do not wear jewelry, make-up, lotions, powders or perfumes, deodorant             Do not wear nail polish on your fingernails.  Do not shave  48 hours prior to surgery.           Do not bring valuables to the hospital. [facility] IS NOT             RESPONSIBLE   FOR VALUABLES.  Contacts, dentures or bridgework may not be worn into surgery.      Patients discharged the day of surgery will not be allowed to drive home. IF YOU ARE HAVING SURGERY AND GOING HOME THE SAME DAY, YOU MUST HAVE AN ADULT TO DRIVE YOU HOME AND BE WITH YOU FOR 24 HOURS. YOU MAY GO HOME BY TAXI OR UBER OR ORTHERWISE, BUT AN ADULT MUST ACCOMPANY YOU HOME AND STAY WITH YOU FOR 24 HOURS.  Name and phone number of your driver:  Special Instructions: N/A              Please read over the following fact sheets you were given: _____________________________________________________________________             [facility] - Preparing for Surgery Before surgery, you can play an important role.  Because skin is not sterile, your skin needs to be as free of germs as possible.  You can reduce the number of germs on your skin by washing with CHG (chlorahexidine gluconate) soap before surgery.  CHG is an antiseptic cleaner which kills germs and bonds with the skin to continue killing germs even after washing. Please DO NOT use if you have an allergy to CHG or antibacterial soaps.  If your skin becomes reddened/irritated stop using the CHG and inform your nurse when you arrive at Short Stay. Do not shave (including legs and underarms) for at least 48 hours prior to the first CHG shower.  You may shave your face/neck. Please follow these instructions  carefully:  1.  Shower with CHG Soap the night before surgery and the  morning of Surgery.  2.  If you choose to wash your hair, wash your hair first as usual with your  normal  shampoo.  3.  After you shampoo, rinse your hair and body thoroughly to remove the  shampoo.                           4.  Use CHG as you would any other liquid soap.  You can apply chg directly  to the skin and wash                       Gently with a scrungie or clean washcloth.  5.  Apply the CHG Soap to your body ONLY FROM THE NECK DOWN.   Do not use on face/ open                           Wound or open sores. Avoid contact with eyes, ears mouth and genitals (private parts).                       Wash face,  Genitals (private parts) with your normal soap.  6.  Wash thoroughly, paying special attention to the area where your surgery  will be performed.  7.  Thoroughly rinse your body with warm water from the neck down.  8.  DO NOT shower/wash with your normal soap after using and rinsing off  the CHG Soap.                9.  Pat yourself dry with a clean towel.            10.  Wear clean pajamas.            11.  Place clean sheets on your bed the night of your first shower and do not  sleep with pets. Day of Surgery : Do not apply any lotions/deodorants the morning of surgery.  Please wear clean clothes to the hospital/surgery center.  FAILURE TO FOLLOW THESE INSTRUCTIONS MAY RESULT IN THE CANCELLATION OF YOUR SURGERY PATIENT SIGNATURE_________________________________  NURSE SIGNATURE__________________________________  ________________________________________________________________________   Adam Phenix  An incentive spirometer is a tool that can help keep your lungs clear and active. This tool measures how well you are filling your lungs with each breath. Taking long deep breaths may help reverse or decrease the chance of developing breathing (pulmonary) problems (especially infection)  following:  A long period of time when you are unable to move or be active. BEFORE THE PROCEDURE   If the spirometer includes an indicator to show your best effort, your nurse or respiratory therapist will set it to a desired goal.  If possible, sit up straight or lean slightly forward. Try not to slouch.  Hold the incentive spirometer in an upright position. INSTRUCTIONS FOR USE  1. Sit on the edge of your bed if possible, or sit up as far as you can in bed or on a chair. 2. Hold the incentive spirometer in an upright position. 3. Breathe out normally. 4. Place the mouthpiece in your mouth and seal your lips tightly around it. 5. Breathe in slowly and as deeply as possible, raising the piston or the ball toward the top of the column. 6. Hold your breath for 3-5 seconds or for as long as possible. Allow the piston or ball to fall to the bottom of the column. 7. Remove the mouthpiece from your mouth and breathe out normally. 8. Rest for a few seconds and repeat Steps 1 through 7 at least 10 times every 1-2 hours when you are awake. Take your time and take a few normal breaths between deep breaths. 9. The spirometer may include an indicator to show your best effort. Use the indicator as a goal to work toward during each repetition. 10. After each set of 10 deep breaths, practice coughing to be sure your lungs are clear. If you have an incision (the cut made at the time of surgery), support your incision when coughing by placing a pillow or rolled up towels firmly against it. Once you are able to get out of bed, walk around indoors and cough well. You may stop using the incentive spirometer when instructed by your caregiver.  RISKS AND COMPLICATIONS  Take your time so you do not get dizzy or light-headed.  If you are in pain, you may need to take or ask for pain medication before doing incentive spirometry. It is harder to take a deep breath if you are having pain. AFTER USE  Rest and  breathe slowly and easily.  It can be helpful to keep track of a log of  your progress. Your caregiver can provide you with a simple table to help with this. If you are using the spirometer at home, follow these instructions: [place] IF:   You are having difficultly using the spirometer.  You have trouble using the spirometer as often as instructed.  Your pain medication is not giving enough relief while using the spirometer.  You develop fever of 100.5 F (38.1 C) or higher. SEEK IMMEDIATE MEDICAL CARE IF:   You cough up bloody sputum that had not been present before.  You develop fever of 102 F (38.9 C) or greater.  You develop worsening pain at or near the incision site. MAKE SURE YOU:   Understand these instructions.  Will watch your condition.  Will get help right away if you are not doing well or get worse. Document Released: 07/18/2006 Document Revised: 05/30/2011 Document Reviewed: 09/18/2006 [facility] Patient Information 2014 [place], [place].   ________________________________________________________________________

## 2019-09-13 ENCOUNTER — Encounter (HOSPITAL_COMMUNITY): Admission: RE | Admit: 2019-09-13 | Payer: Medicare HMO | Source: Ambulatory Visit

## 2019-09-13 ENCOUNTER — Encounter (HOSPITAL_COMMUNITY)
Admission: RE | Admit: 2019-09-13 | Discharge: 2019-09-13 | Disposition: A | Discharge status: Home / Self Care | Payer: Medicare HMO | Source: Ambulatory Visit | Attending: Orthopedic Surgery | Admitting: Orthopedic Surgery

## 2019-09-13 ENCOUNTER — Other Ambulatory Visit: Payer: Self-pay

## 2019-09-13 ENCOUNTER — Encounter (HOSPITAL_COMMUNITY): Payer: Self-pay

## 2019-09-13 DIAGNOSIS — Z01818 Encounter for other preprocedural examination: Secondary | ICD-10-CM | POA: Insufficient documentation

## 2019-09-13 HISTORY — DX: Radiculopathy, site unspecified: M54.10

## 2019-09-13 HISTORY — DX: Scoliosis, unspecified: M41.9

## 2019-09-13 LAB — SURGICAL PCR SCREEN
MRSA, PCR: NEGATIVE
Staphylococcus aureus: NEGATIVE

## 2019-09-13 LAB — GLUCOSE, CAPILLARY: Glucose-Capillary: 109 mg/dL — ABNORMAL HIGH (ref 70–99)

## 2019-09-13 LAB — CBC
HCT: 38 % (ref 36.0–46.0)
Hemoglobin: 12.1 g/dL (ref 12.0–15.0)
MCH: 28.5 pg (ref 26.0–34.0)
MCHC: 31.8 g/dL (ref 30.0–36.0)
MCV: 89.6 fL (ref 80.0–100.0)
Platelets: 228 10*3/uL (ref 150–400)
RBC: 4.24 MIL/uL (ref 3.87–5.11)
RDW: 12.7 % (ref 11.5–15.5)
WBC: 5.2 10*3/uL (ref 4.0–10.5)
nRBC: 0 % (ref 0.0–0.2)

## 2019-09-13 LAB — BASIC METABOLIC PANEL
Anion gap: 9 (ref 5–15)
BUN: 17 mg/dL (ref 8–23)
CO2: 27 mmol/L (ref 22–32)
Calcium: 9.2 mg/dL (ref 8.9–10.3)
Chloride: 101 mmol/L (ref 98–111)
Creatinine, Ser: 0.95 mg/dL (ref 0.44–1.00)
GFR calc Af Amer: >60 mL/min (ref >60)
GFR calc non Af Amer: >60 mL/min (ref >60)
Glucose, Bld: 109 mg/dL — ABNORMAL HIGH (ref 70–99)
Potassium: 3.9 mmol/L (ref 3.5–5.1)
Sodium: 137 mmol/L (ref 135–145)

## 2019-09-13 LAB — TYPE AND SCREEN
ABO/RH(D): AB POS
Antibody Screen: NEGATIVE

## 2019-09-13 LAB — HEMOGLOBIN A1C
Hgb A1c MFr Bld: 6.9 % — ABNORMAL HIGH (ref 4.8–5.6)
Mean Plasma Glucose: 151.33 mg/dL

## 2019-09-13 NOTE — Progress Notes (Addendum)
PCP - Legrand Pitts  Cardiologist - Dr. Darlyn Chamber   PPM/ICD -  Device Orders -  Rep Notified -   Chest x-ray -  EKG - requested 09-13-19 on chart Stress Test - Lexiscan requested 09-13-19 on chart ECHO - 08-01-19 on chart Cardiac Cath -   Sleep Study -  CPAP -   Fasting Blood Sugar - 130's Checks Blood Sugar ___2__ times a day  Blood Thinner Instructions:Pletal per pt. Dr. Boneta Lucks office said she could continue Aspirin Instructions:  ERAS Protcol - PRE-SURGERY  G2-   COVID TEST- 7-6/ vaccinated last dose 5-29 moderna    Anesthesia review: HTN DM   Patient denies shortness of breath, fever, cough and chest pain at PAT appointment  none  Unable to walk stairs due to pain otherwise she states she could . Does light housework also due to pain   All instructions explained to the patient, with a verbal understanding of the material. Patient agrees to go over the instructions while at home for a better understanding. Patient also instructed to self quarantine after being tested for COVID-19. The opportunity to ask questions was provided.

## 2019-09-18 NOTE — H&P (Addendum)
TOTAL HIP REVISION ADMISSION H&P  Patient is admitted for left revision total hip arthroplasty, acetabulum.  Subjective:  Chief Complaint: Left hip pain s/p THA  HPI: Ariel Harrington, 64 y.o. female, has a history of pain and functional disability in the left hip due to trauma and loosening of the acetabulum and patient has failed non-surgical conservative treatments for greater than 12 weeks to include NSAID's and/or analgesics, use of assistive devices and activity modification. The indications for the revision total hip arthroplasty are loosening of one or more components.  Onset of symptoms was abrupt starting 1 years ago with gradually worsening course since that time.  Prior procedures on the left hip include arthroplasty.  Patient currently rates pain in the left hip at 10 out of 10 with activity.  There is worsening of pain with activity and weight bearing, trendelenberg gait, pain that interfers with activities of daily living and pain with passive range of motion. Patient has evidence of prosthetic loosening by imaging studies.  This condition presents safety issues increasing the risk of falls.  There is no current active infection.  Risks, benefits and expectations were discussed with the patient.  Risks including but not limited to the risk of anesthesia, blood clots, nerve damage, blood vessel damage, failure of the prosthesis, infection and up to and including death.  Patient understand the risks, benefits and expectations and wishes to proceed with surgery.   PCP: Legrand Pitts, PA  D/C Plans:       Home   Post-op Meds:       No Rx given   Tranexamic Acid:      To be given - IV   Decadron:      Is to be given  FYI:     ASA  Oxycodone   DME:   Rx sent for - RW & 3-n-1  PT:   OPPT -  [facility] in [place], [place] (Rx already sent)  Pharmacy: [facility], [place]    Patient Active Problem List   Diagnosis Date Noted  . Overweight (BMI 25.0-29.9) 03/08/2017  . S/P  left THA, AA 03/07/2017   Past Medical History:  Diagnosis Date  . Anemia   . Arthritis   . Asthma   . Depression   . Diabetes mellitus without complication (HCC)    type 2  . GERD (gastroesophageal reflux disease)   . Hypertension   . Radiculopathy   . Scoliosis     Past Surgical History:  Procedure Laterality Date  . KNEE ARTHROSCOPY Right   . shoulder    . TONSILLECTOMY    . TOTAL HIP ARTHROPLASTY Left 03/07/2017   Procedure: LEFT TOTAL HIP ARTHROPLASTY ANTERIOR APPROACH;  Surgeon: Durene Romans, MD;  Location: [facility];  Service: Orthopedics;  Laterality: Left;  70 mins    No current facility-administered medications for this encounter.   Current Outpatient Medications  Medication Sig Dispense Refill Last Dose  . albuterol (PROVENTIL HFA;VENTOLIN HFA) 108 (90 Base) MCG/ACT inhaler Inhale 2 puffs into the lungs every 6 (six) hours as needed for wheezing or shortness of breath.     . carvedilol (COREG) 12.5 MG tablet Take 12.5 mg by mouth 2 (two) times daily.     . cilostazol (PLETAL) 100 MG tablet Take 100 mg by mouth 2 (two) times daily.     . diclofenac Sodium (VOLTAREN) 1 % GEL Apply 1 application topically 4 (four) times daily as needed (pain.).      Marland Kitchen docusate sodium (COLACE)  100 MG capsule Take 1 capsule (100 mg total) by mouth 2 (two) times daily. (Patient taking differently: Take 100 mg by mouth 2 (two) times daily as needed (constipation.). ) 10 capsule 0   . DULoxetine (CYMBALTA) 60 MG capsule Take 60 mg by mouth in the morning and at bedtime.      . fluticasone (FLONASE) 50 MCG/ACT nasal spray Place 1-2 sprays into both nostrils 2 (two) times daily as needed for allergies or rhinitis.     Marland Kitchen HYDROcodone-acetaminophen (NORCO) 7.5-325 MG tablet Take 1 tablet by mouth 4 (four) times daily as needed (pain.).      Marland Kitchen JANUVIA 100 MG tablet Take 100 mg by mouth daily.     Marland Kitchen LIVALO 4 MG TABS Take 4 mg by mouth daily.      . metFORMIN (GLUCOPHAGE) 1000 MG tablet Take 1,000 mg by  mouth in the morning and at bedtime.      Marland Kitchen olmesartan (BENICAR) 20 MG tablet Take 20 mg by mouth daily.     . pantoprazole (PROTONIX) 40 MG tablet Take 40 mg by mouth daily.      Allergies  Allergen Reactions  . Bactrim [Sulfamethoxazole-Trimethoprim] Shortness Of Breath  . Aspirin Other (See Comments)    Irritates stomach  . Codeine Other (See Comments)    Sweat and get really hot  . Gabapentin Itching  . Ibuprofen Other (See Comments)    Irritates stomach  . Lyrica [Pregabalin] Other (See Comments)    Blister    Social History   Tobacco Use  . Smoking status: Former Smoker    Packs/day: 1.00    Years: 7.00    Pack years: 7.00    Types: Cigarettes    Quit date: 08/1974    Years since quitting: 45.1  . Smokeless tobacco: Never Used  Substance Use Topics  . Alcohol use: No     Review of Systems  Constitutional: Negative.   HENT: Negative.   Eyes: Negative.   Respiratory: Negative.   Cardiovascular: Negative.   Gastrointestinal: Positive for constipation.  Genitourinary: Negative.   Musculoskeletal: Positive for joint pain.  Skin: Negative.   Neurological: Negative.   Endo/Heme/Allergies: Positive for environmental allergies.  Psychiatric/Behavioral: Negative.       Objective:  Physical Exam Constitutional:      Appearance: She is well-developed.  HENT:     Head: Normocephalic.  Eyes:     Pupils: Pupils are equal, round, and reactive to light.  Neck:     Thyroid: No thyromegaly.     Vascular: No JVD.     Trachea: No tracheal deviation.  Cardiovascular:     Rate and Rhythm: Normal rate and regular rhythm.  Pulmonary:     Effort: Pulmonary effort is normal. No respiratory distress.     Breath sounds: Normal breath sounds. No wheezing.  Abdominal:     Palpations: Abdomen is soft.     Tenderness: There is no abdominal tenderness. There is no guarding.  Musculoskeletal:     Cervical back: Neck supple.     Left hip: Tenderness and bony tenderness  present. Decreased range of motion. Decreased strength.  Lymphadenopathy:     Cervical: No cervical adenopathy.  Skin:    General: Skin is warm and dry.  Neurological:     Mental Status: She is alert and oriented to person, place, and time.       Labs:  Estimated body mass index is 29.55 kg/m as calculated from the following:   Height  as of 09/13/19: 5\' 6"  (1.676 m).   Weight as of 09/13/19: 83.1 kg.  Imaging Review:  Plain radiographsfailure of the left hip. There is evidence of loosening of the acetabular cup.The bone quality appears to be good for age and reported activity level.     Assessment/Plan:  Left hip with failed previous arthroplasty.  The patient history, physical examination, clinical judgement of the provider and imaging studies are consistent with failure of the left hip, previous total hip arthroplasty. Revision total hip arthroplasty is deemed medically necessary. The treatment options including medical management, injection therapy, arthroscopy and arthroplasty were discussed at length. The risks and benefits of total hip arthroplasty were presented and reviewed. The risks due to aseptic loosening, infection, stiffness, dislocation/subluxation,  thromboembolic complications and other imponderables were discussed.  The patient acknowledged the explanation, agreed to proceed with the plan and consent was signed. Patient is being admitted for treatment for surgery, pain control, PT, OT, prophylactic antibiotics, VTE prophylaxis, progressive ambulation and ADL's and discharge planning. The patient is planning to be discharged home.     09/15/19 Eisa Conaway   PA-C  09/18/2019, 3:13 PM

## 2019-09-19 NOTE — Anesthesia Preprocedure Evaluation (Addendum)
Anesthesia Evaluation  Patient identified by MRN, date of birth, ID band Patient awake    Reviewed: Allergy & Precautions, NPO status , Patient's Chart, lab work & pertinent test results, reviewed documented beta blocker date and time   Airway Mallampati: II  TM Distance: >3 FB Neck ROM: Full    Dental  (+) Loose, Dental Advisory Given,    Pulmonary asthma , former smoker,    Pulmonary exam normal breath sounds clear to auscultation       Cardiovascular hypertension, Pt. on home beta blockers and Pt. on medications negative cardio ROS Normal cardiovascular exam Rhythm:Regular Rate:Normal  Nuclear stress test 08/09/19 ([facility]): 1. Negative pharmacologic stress test for the detection of ischemia, but abnormal due to artifact.  2. Normal SPECT perfusion imaging with artifact: bowel attenuation artifact 3. Preserved LV systolic function EF 62%  Echo 08/01/19 ([facility]): 1. Normal biventricular dimensions and systolic function 2. No major valvular abnormalities noted 3. Mildly dilated aortic root   Neuro/Psych PSYCHIATRIC DISORDERS Depression negative neurological ROS     GI/Hepatic Neg liver ROS, GERD  Medicated,  Endo/Other  negative endocrine ROSdiabetes, Type 2, Oral Hypoglycemic Agents  Renal/GU negative Renal ROS  negative genitourinary   Musculoskeletal  (+) Arthritis , Osteoarthritis,    Abdominal   Peds  Hematology negative hematology ROS (+)   Anesthesia Other Findings Took cilostazol at 8pm yesterday  Reproductive/Obstetrics                          Anesthesia Physical Anesthesia Plan  ASA: III  Anesthesia Plan: General   Post-op Pain Management:    Induction: Intravenous  PONV Risk Score and Plan: 3 and Treatment may vary due to age or medical condition, Propofol infusion, Ondansetron, Dexamethasone and Midazolam  Airway Management  Planned: Oral ETT  Additional Equipment:   Intra-op Plan:   Post-operative Plan: Extubation in OR  Informed Consent: I have reviewed the patients History and Physical, chart, labs and discussed the procedure including the risks, benefits and alternatives for the proposed anesthesia with the patient or authorized representative who has indicated his/her understanding and acceptance.     Dental advisory given  Plan Discussed with: CRNA  Anesthesia Plan Comments:       Anesthesia Quick Evaluation

## 2019-09-19 NOTE — Progress Notes (Signed)
Anesthesia Chart Review:   Case: [id number] Date/Time: 09/26/19 0825   Procedure: ANTERIOR APPROACH TOTAL HIP REVISION, ACETABULAR COMPONENT (Left Hip) - 2 HRS   Anesthesia type: Spinal   Pre-op diagnosis: Failed left total hip replacement, failure of acetabular   Location: [facility] ROOM 10 / [facility]   Surgeons: Durene Romans, MD      DISCUSSION: Pt is a 64 year old with hx HTN, PAD, DM   VS: BP 136/78   Pulse 85   Temp 36.8 C (Oral)   Resp 16   Ht 5\' 6"  (1.676 m)   Wt 83.1 kg   SpO2 99%   BMI 29.55 kg/m    PROVIDERS: - PCP is , PA - Cardiologist is Legrand Pitts, MD. Last office visit 07/19/19. Comment on stress test reports pt is at intermediate cardiac risk for surgery   LABS: Labs reviewed: Acceptable for surgery. (all labs ordered are listed, but only abnormal results are displayed)  Labs Reviewed  BASIC METABOLIC PANEL - Abnormal; Notable for the following components:      Result Value   Glucose, Bld 109 (*)    All other components within normal limits  HEMOGLOBIN A1C - Abnormal; Notable for the following components:   Hgb A1c MFr Bld 6.9 (*)    All other components within normal limits  GLUCOSE, CAPILLARY - Abnormal; Notable for the following components:   Glucose-Capillary 109 (*)    All other components within normal limits  SURGICAL PCR SCREEN  CBC  TYPE AND SCREEN    EKG 07/24/19 ([facility]): SR. Low QRS voltage. Nonspecific T wave abnormality    CV: Nuclear stress test 08/09/19 ([facility]): 1. Negative pharmacologic stress test for the detection of ischemia, but abnormal due to artifact.  2. Normal SPECT perfusion imaging with artifact: bowel attenuation artifact 3. Preserved LV systolic function EF 62%  Echo 08/01/19 ([facility]): 1. Normal biventricular dimensions and systolic function 2. No major valvular abnormalities noted 3. Mildly dilated aortic root   Past Medical History:   Diagnosis Date  . Anemia   . Arthritis   . Asthma   . Depression   . Diabetes mellitus without complication (HCC)    type 2  . GERD (gastroesophageal reflux disease)   . Hypertension   . Radiculopathy   . Scoliosis     Past Surgical History:  Procedure Laterality Date  . KNEE ARTHROSCOPY Right   . shoulder    . TONSILLECTOMY    . TOTAL HIP ARTHROPLASTY Left 03/07/2017   Procedure: LEFT TOTAL HIP ARTHROPLASTY ANTERIOR APPROACH;  Surgeon: 03/09/2017, MD;  Location: [facility];  Service: Orthopedics;  Laterality: Left;  70 mins    MEDICATIONS: . albuterol (PROVENTIL HFA;VENTOLIN HFA) 108 (90 Base) MCG/ACT inhaler  . carvedilol (COREG) 12.5 MG tablet  . cilostazol (PLETAL) 100 MG tablet  . diclofenac Sodium (VOLTAREN) 1 % GEL  . docusate sodium (COLACE) 100 MG capsule  . DULoxetine (CYMBALTA) 60 MG capsule  . fluticasone (FLONASE) 50 MCG/ACT nasal spray  . HYDROcodone-acetaminophen (NORCO) 7.5-325 MG tablet  . JANUVIA 100 MG tablet  . LIVALO 4 MG TABS  . metFORMIN (GLUCOPHAGE) 1000 MG tablet  . olmesartan (BENICAR) 20 MG tablet  . pantoprazole (PROTONIX) 40 MG tablet   No current facility-administered medications for this encounter.    If no changes, I anticipate pt can proceed with surgery as scheduled.   [birth year]-19-1978, FNP-BC [facility]/Anesthesiology Phone: (  [phone] 09/19/2019 12:40 PM

## 2019-09-24 ENCOUNTER — Other Ambulatory Visit (HOSPITAL_COMMUNITY)
Admission: RE | Admit: 2019-09-24 | Discharge: 2019-09-24 | Disposition: A | Discharge status: Home / Self Care | Payer: Medicare HMO | Source: Ambulatory Visit | Attending: Orthopedic Surgery | Admitting: Orthopedic Surgery

## 2019-09-24 DIAGNOSIS — Z20822 Contact with and (suspected) exposure to covid-19: Secondary | ICD-10-CM | POA: Diagnosis not present

## 2019-09-24 DIAGNOSIS — Z01812 Encounter for preprocedural laboratory examination: Secondary | ICD-10-CM | POA: Insufficient documentation

## 2019-09-24 LAB — SARS CORONAVIRUS 2 (TAT 6-24 HRS): SARS Coronavirus 2: NEGATIVE

## 2019-09-25 NOTE — Progress Notes (Signed)
Pt. Aware of time change arrive at 6:10 am eras drink complete by 0540

## 2019-09-26 ENCOUNTER — Encounter (HOSPITAL_COMMUNITY)
Admission: RE | Disposition: A | Discharge status: Home / Self Care | Payer: Self-pay | Source: Other Acute Inpatient Hospital | Attending: Orthopedic Surgery

## 2019-09-26 ENCOUNTER — Observation Stay (HOSPITAL_COMMUNITY)
Admission: RE | Admit: 2019-09-26 | Discharge: 2019-09-27 | Disposition: A | Discharge status: Home / Self Care | Payer: Medicare HMO | Source: Other Acute Inpatient Hospital | Attending: Orthopedic Surgery | Admitting: Orthopedic Surgery

## 2019-09-26 ENCOUNTER — Ambulatory Visit (HOSPITAL_COMMUNITY): Payer: Medicare HMO

## 2019-09-26 ENCOUNTER — Other Ambulatory Visit: Payer: Self-pay

## 2019-09-26 ENCOUNTER — Observation Stay (HOSPITAL_COMMUNITY): Payer: Medicare HMO

## 2019-09-26 ENCOUNTER — Ambulatory Visit (HOSPITAL_COMMUNITY): Payer: Medicare HMO | Admitting: Emergency Medicine

## 2019-09-26 ENCOUNTER — Encounter (HOSPITAL_COMMUNITY): Payer: Self-pay | Admitting: Orthopedic Surgery

## 2019-09-26 ENCOUNTER — Ambulatory Visit (HOSPITAL_COMMUNITY): Payer: Medicare HMO | Admitting: Certified Registered"

## 2019-09-26 DIAGNOSIS — T84091A Other mechanical complication of internal left hip prosthesis, initial encounter: Secondary | ICD-10-CM | POA: Insufficient documentation

## 2019-09-26 DIAGNOSIS — I10 Essential (primary) hypertension: Secondary | ICD-10-CM | POA: Diagnosis not present

## 2019-09-26 DIAGNOSIS — E119 Type 2 diabetes mellitus without complications: Secondary | ICD-10-CM | POA: Insufficient documentation

## 2019-09-26 DIAGNOSIS — Z7984 Long term (current) use of oral hypoglycemic drugs: Secondary | ICD-10-CM | POA: Diagnosis not present

## 2019-09-26 DIAGNOSIS — R1030 Lower abdominal pain, unspecified: Secondary | ICD-10-CM | POA: Insufficient documentation

## 2019-09-26 DIAGNOSIS — Z79899 Other long term (current) drug therapy: Secondary | ICD-10-CM | POA: Insufficient documentation

## 2019-09-26 DIAGNOSIS — R2689 Other abnormalities of gait and mobility: Secondary | ICD-10-CM | POA: Diagnosis not present

## 2019-09-26 DIAGNOSIS — Z419 Encounter for procedure for purposes other than remedying health state, unspecified: Secondary | ICD-10-CM

## 2019-09-26 DIAGNOSIS — T8484XA Pain due to internal orthopedic prosthetic devices, implants and grafts, initial encounter: Secondary | ICD-10-CM | POA: Diagnosis present

## 2019-09-26 DIAGNOSIS — Z96649 Presence of unspecified artificial hip joint: Secondary | ICD-10-CM

## 2019-09-26 HISTORY — PX: TOTAL HIP REVISION: SHX763

## 2019-09-26 LAB — GLUCOSE, CAPILLARY: Glucose-Capillary: 130 mg/dL — ABNORMAL HIGH (ref 70–99)

## 2019-09-26 SURGERY — TOTAL HIP REVISION
Anesthesia: General | Site: Hip | Laterality: Left

## 2019-09-26 MED ORDER — TRANEXAMIC ACID-NACL 1000-0.7 MG/100ML-% IV SOLN
1000.0000 mg | Freq: Once | INTRAVENOUS | Status: AC
  Administered 2019-09-26: 1000 mg via INTRAVENOUS
  Filled 2019-09-26: qty 100

## 2019-09-26 MED ORDER — FENTANYL CITRATE (PF) 100 MCG/2ML IJ SOLN
INTRAMUSCULAR | Status: AC
  Filled 2019-09-26: qty 2

## 2019-09-26 MED ORDER — HYDROMORPHONE HCL 1 MG/ML IJ SOLN
0.5000 mg | INTRAMUSCULAR | Status: DC | PRN
  Administered 2019-09-26: 0.5 mg via INTRAVENOUS

## 2019-09-26 MED ORDER — METHOCARBAMOL 500 MG IVPB - SIMPLE MED
500.0000 mg | Freq: Four times a day (QID) | INTRAVENOUS | Status: DC | PRN
  Administered 2019-09-26: 500 mg via INTRAVENOUS
  Filled 2019-09-26: qty 50

## 2019-09-26 MED ORDER — PROPOFOL 10 MG/ML IV BOLUS
INTRAVENOUS | Status: AC
  Filled 2019-09-26: qty 20

## 2019-09-26 MED ORDER — CILOSTAZOL 100 MG PO TABS
100.0000 mg | ORAL_TABLET | Freq: Two times a day (BID) | ORAL | Status: DC
  Administered 2019-09-27 (×2): 100 mg via ORAL
  Filled 2019-09-26 (×3): qty 1

## 2019-09-26 MED ORDER — CEFAZOLIN SODIUM-DEXTROSE 2-4 GM/100ML-% IV SOLN
2.0000 g | Freq: Four times a day (QID) | INTRAVENOUS | Status: AC
  Administered 2019-09-26 (×2): 2 g via INTRAVENOUS
  Filled 2019-09-26 (×2): qty 100

## 2019-09-26 MED ORDER — DOCUSATE SODIUM 100 MG PO CAPS
100.0000 mg | ORAL_CAPSULE | Freq: Two times a day (BID) | ORAL | Status: DC
  Administered 2019-09-26 – 2019-09-27 (×2): 100 mg via ORAL
  Filled 2019-09-26 (×2): qty 1

## 2019-09-26 MED ORDER — PROPOFOL 500 MG/50ML IV EMUL
INTRAVENOUS | Status: DC | PRN
  Administered 2019-09-26: 150 ug/kg/min via INTRAVENOUS

## 2019-09-26 MED ORDER — METFORMIN HCL 500 MG PO TABS
1000.0000 mg | ORAL_TABLET | Freq: Two times a day (BID) | ORAL | Status: DC
  Administered 2019-09-26 – 2019-09-27 (×2): 1000 mg via ORAL
  Filled 2019-09-26 (×2): qty 2

## 2019-09-26 MED ORDER — ALBUMIN HUMAN 5 % IV SOLN
INTRAVENOUS | Status: AC
  Filled 2019-09-26: qty 250

## 2019-09-26 MED ORDER — DIPHENHYDRAMINE HCL 12.5 MG/5ML PO ELIX: 12.5000 mg | ORAL_SOLUTION | ORAL | Status: DC | PRN

## 2019-09-26 MED ORDER — HYDROMORPHONE HCL 1 MG/ML IJ SOLN
0.5000 mg | INTRAMUSCULAR | Status: DC | PRN
  Administered 2019-09-26 (×2): 1 mg via INTRAVENOUS
  Filled 2019-09-26 (×2): qty 1

## 2019-09-26 MED ORDER — ROCURONIUM BROMIDE 10 MG/ML (PF) SYRINGE
PREFILLED_SYRINGE | INTRAVENOUS | Status: AC
  Filled 2019-09-26: qty 10

## 2019-09-26 MED ORDER — PHENYLEPHRINE HCL-NACL 10-0.9 MG/250ML-% IV SOLN
INTRAVENOUS | Status: DC | PRN
Start: 2019-09-26 — End: 2019-09-26
  Administered 2019-09-26: 20 ug/min via INTRAVENOUS

## 2019-09-26 MED ORDER — METHOCARBAMOL 500 MG PO TABS
500.0000 mg | ORAL_TABLET | Freq: Four times a day (QID) | ORAL | Status: DC | PRN
  Administered 2019-09-26 – 2019-09-27 (×3): 500 mg via ORAL
  Filled 2019-09-26 (×3): qty 1

## 2019-09-26 MED ORDER — ONDANSETRON HCL 4 MG/2ML IJ SOLN
INTRAMUSCULAR | Status: AC
  Filled 2019-09-26: qty 2

## 2019-09-26 MED ORDER — ACETAMINOPHEN 500 MG PO TABS: 1000.0000 mg | ORAL_TABLET | Freq: Once | ORAL | Status: DC

## 2019-09-26 MED ORDER — FERROUS SULFATE 325 (65 FE) MG PO TABS
325.0000 mg | ORAL_TABLET | Freq: Three times a day (TID) | ORAL | Status: DC
  Administered 2019-09-27: 325 mg via ORAL
  Filled 2019-09-26: qty 1

## 2019-09-26 MED ORDER — HYDROMORPHONE HCL 1 MG/ML IJ SOLN
INTRAMUSCULAR | Status: AC
  Administered 2019-09-26: 0.5 mg via INTRAVENOUS
  Filled 2019-09-26: qty 1

## 2019-09-26 MED ORDER — CHLORHEXIDINE GLUCONATE 0.12 % MT SOLN
15.0000 mL | Freq: Once | OROMUCOSAL | Status: AC
  Administered 2019-09-26: 15 mL via OROMUCOSAL

## 2019-09-26 MED ORDER — LIDOCAINE 2% (20 MG/ML) 5 ML SYRINGE
INTRAMUSCULAR | Status: DC | PRN
  Administered 2019-09-26: 40 mg via INTRAVENOUS

## 2019-09-26 MED ORDER — LIDOCAINE 2% (20 MG/ML) 5 ML SYRINGE
INTRAMUSCULAR | Status: AC
  Filled 2019-09-26: qty 5

## 2019-09-26 MED ORDER — PANTOPRAZOLE SODIUM 40 MG PO TBEC
40.0000 mg | DELAYED_RELEASE_TABLET | Freq: Every day | ORAL | Status: DC
  Administered 2019-09-27: 40 mg via ORAL
  Filled 2019-09-26: qty 1

## 2019-09-26 MED ORDER — ONDANSETRON HCL 4 MG/2ML IJ SOLN: 4.0000 mg | Freq: Four times a day (QID) | INTRAMUSCULAR | Status: DC | PRN

## 2019-09-26 MED ORDER — IRBESARTAN 150 MG PO TABS
150.0000 mg | ORAL_TABLET | Freq: Every day | ORAL | Status: DC
  Administered 2019-09-26 – 2019-09-27 (×2): 150 mg via ORAL
  Filled 2019-09-26 (×2): qty 1

## 2019-09-26 MED ORDER — BISACODYL 10 MG RE SUPP: 10.0000 mg | Freq: Every day | RECTAL | Status: DC | PRN

## 2019-09-26 MED ORDER — CEFAZOLIN SODIUM-DEXTROSE 2-4 GM/100ML-% IV SOLN
2.0000 g | INTRAVENOUS | Status: AC
  Administered 2019-09-26: 2 g via INTRAVENOUS
  Filled 2019-09-26: qty 100

## 2019-09-26 MED ORDER — MIDAZOLAM HCL 2 MG/2ML IJ SOLN
INTRAMUSCULAR | Status: DC | PRN
  Administered 2019-09-26: 2 mg via INTRAVENOUS

## 2019-09-26 MED ORDER — PROPOFOL 1000 MG/100ML IV EMUL
INTRAVENOUS | Status: AC
  Filled 2019-09-26: qty 100

## 2019-09-26 MED ORDER — DEXAMETHASONE SODIUM PHOSPHATE 10 MG/ML IJ SOLN
10.0000 mg | Freq: Once | INTRAMUSCULAR | Status: AC
  Administered 2019-09-27: 10 mg via INTRAVENOUS
  Filled 2019-09-26: qty 1

## 2019-09-26 MED ORDER — LINAGLIPTIN 5 MG PO TABS
5.0000 mg | ORAL_TABLET | Freq: Every day | ORAL | Status: DC
  Administered 2019-09-26 – 2019-09-27 (×2): 5 mg via ORAL
  Filled 2019-09-26 (×2): qty 1

## 2019-09-26 MED ORDER — FENTANYL CITRATE (PF) 250 MCG/5ML IJ SOLN
INTRAMUSCULAR | Status: DC | PRN
  Administered 2019-09-26 (×6): 50 ug via INTRAVENOUS
  Administered 2019-09-26: 100 ug via INTRAVENOUS

## 2019-09-26 MED ORDER — METOCLOPRAMIDE HCL 5 MG PO TABS: 5.0000 mg | ORAL_TABLET | Freq: Three times a day (TID) | ORAL | Status: DC | PRN

## 2019-09-26 MED ORDER — MENTHOL 3 MG MT LOZG: 1.0000 | LOZENGE | OROMUCOSAL | Status: DC | PRN

## 2019-09-26 MED ORDER — PROPOFOL 10 MG/ML IV BOLUS
INTRAVENOUS | Status: DC | PRN
  Administered 2019-09-26: 170 mg via INTRAVENOUS

## 2019-09-26 MED ORDER — PHENYLEPHRINE 40 MCG/ML (10ML) SYRINGE FOR IV PUSH (FOR BLOOD PRESSURE SUPPORT)
PREFILLED_SYRINGE | INTRAVENOUS | Status: AC
  Filled 2019-09-26: qty 30

## 2019-09-26 MED ORDER — TRANEXAMIC ACID-NACL 1000-0.7 MG/100ML-% IV SOLN
1000.0000 mg | INTRAVENOUS | Status: AC
  Administered 2019-09-26: 1000 mg via INTRAVENOUS
  Filled 2019-09-26: qty 100

## 2019-09-26 MED ORDER — HYDROCODONE-ACETAMINOPHEN 7.5-325 MG PO TABS
1.0000 | ORAL_TABLET | ORAL | Status: DC | PRN
  Administered 2019-09-27 (×3): 2 via ORAL
  Filled 2019-09-26 (×3): qty 2

## 2019-09-26 MED ORDER — ALUM & MAG HYDROXIDE-SIMETH 200-200-20 MG/5ML PO SUSP: 15.0000 mL | ORAL | Status: DC | PRN

## 2019-09-26 MED ORDER — ONDANSETRON HCL 4 MG PO TABS: 4.0000 mg | ORAL_TABLET | Freq: Four times a day (QID) | ORAL | Status: DC | PRN

## 2019-09-26 MED ORDER — ALBUMIN HUMAN 5 % IV SOLN: INTRAVENOUS | Status: DC | PRN

## 2019-09-26 MED ORDER — FENTANYL CITRATE (PF) 100 MCG/2ML IJ SOLN
INTRAMUSCULAR | Status: AC
  Administered 2019-09-26: 50 ug via INTRAVENOUS
  Filled 2019-09-26: qty 2

## 2019-09-26 MED ORDER — PHENYLEPHRINE 40 MCG/ML (10ML) SYRINGE FOR IV PUSH (FOR BLOOD PRESSURE SUPPORT)
PREFILLED_SYRINGE | INTRAVENOUS | Status: DC | PRN
  Administered 2019-09-26: 80 ug via INTRAVENOUS

## 2019-09-26 MED ORDER — ALBUTEROL SULFATE (2.5 MG/3ML) 0.083% IN NEBU: 3.0000 mL | INHALATION_SOLUTION | Freq: Four times a day (QID) | RESPIRATORY_TRACT | Status: DC | PRN

## 2019-09-26 MED ORDER — MIDAZOLAM HCL 2 MG/2ML IJ SOLN
INTRAMUSCULAR | Status: AC
  Filled 2019-09-26: qty 2

## 2019-09-26 MED ORDER — FLUTICASONE PROPIONATE 50 MCG/ACT NA SUSP: 1.0000 | Freq: Two times a day (BID) | NASAL | Status: DC | PRN

## 2019-09-26 MED ORDER — DULOXETINE HCL 60 MG PO CPEP
60.0000 mg | ORAL_CAPSULE | Freq: Two times a day (BID) | ORAL | Status: DC
  Administered 2019-09-26 – 2019-09-27 (×2): 60 mg via ORAL
  Filled 2019-09-26 (×2): qty 1

## 2019-09-26 MED ORDER — ONDANSETRON HCL 4 MG/2ML IJ SOLN
INTRAMUSCULAR | Status: DC | PRN
  Administered 2019-09-26: 4 mg via INTRAVENOUS

## 2019-09-26 MED ORDER — HYDROCODONE-ACETAMINOPHEN 5-325 MG PO TABS
1.0000 | ORAL_TABLET | ORAL | Status: DC | PRN
  Administered 2019-09-26: 1 via ORAL
  Filled 2019-09-26: qty 1

## 2019-09-26 MED ORDER — SODIUM CHLORIDE 0.9 % IV SOLN: INTRAVENOUS | Status: DC

## 2019-09-26 MED ORDER — ACETAMINOPHEN 325 MG PO TABS: 325.0000 mg | ORAL_TABLET | Freq: Four times a day (QID) | ORAL | Status: DC | PRN

## 2019-09-26 MED ORDER — POLYETHYLENE GLYCOL 3350 17 G PO PACK
17.0000 g | PACK | Freq: Two times a day (BID) | ORAL | Status: DC
  Administered 2019-09-26 – 2019-09-27 (×2): 17 g via ORAL
  Filled 2019-09-26 (×2): qty 1

## 2019-09-26 MED ORDER — PHENOL 1.4 % MT LIQD: 1.0000 | OROMUCOSAL | Status: DC | PRN

## 2019-09-26 MED ORDER — DEXAMETHASONE SODIUM PHOSPHATE 10 MG/ML IJ SOLN
10.0000 mg | Freq: Once | INTRAMUSCULAR | Status: AC
  Administered 2019-09-26: 10 mg via INTRAVENOUS

## 2019-09-26 MED ORDER — PRAVASTATIN SODIUM 20 MG PO TABS: 80.0000 mg | ORAL_TABLET | Freq: Every day | ORAL | Status: DC

## 2019-09-26 MED ORDER — ROCURONIUM BROMIDE 10 MG/ML (PF) SYRINGE
PREFILLED_SYRINGE | INTRAVENOUS | Status: DC | PRN
  Administered 2019-09-26: 80 mg via INTRAVENOUS
  Administered 2019-09-26: 20 mg via INTRAVENOUS
  Administered 2019-09-26 (×2): 10 mg via INTRAVENOUS

## 2019-09-26 MED ORDER — METOCLOPRAMIDE HCL 5 MG/ML IJ SOLN: 5.0000 mg | Freq: Three times a day (TID) | INTRAMUSCULAR | Status: DC | PRN

## 2019-09-26 MED ORDER — FENTANYL CITRATE (PF) 100 MCG/2ML IJ SOLN
INTRAMUSCULAR | Status: AC
  Administered 2019-09-26: 25 ug via INTRAVENOUS
  Filled 2019-09-26: qty 2

## 2019-09-26 MED ORDER — 0.9 % SODIUM CHLORIDE (POUR BTL) OPTIME
TOPICAL | Status: DC | PRN
  Administered 2019-09-26: 1000 mL

## 2019-09-26 MED ORDER — ORAL CARE MOUTH RINSE: 15.0000 mL | Freq: Once | OROMUCOSAL | Status: AC

## 2019-09-26 MED ORDER — FENTANYL CITRATE (PF) 100 MCG/2ML IJ SOLN
25.0000 ug | INTRAMUSCULAR | Status: DC | PRN
  Administered 2019-09-26: 25 ug via INTRAVENOUS
  Administered 2019-09-26: 50 ug via INTRAVENOUS

## 2019-09-26 MED ORDER — CARVEDILOL 12.5 MG PO TABS
12.5000 mg | ORAL_TABLET | Freq: Two times a day (BID) | ORAL | Status: DC
  Administered 2019-09-26 – 2019-09-27 (×2): 12.5 mg via ORAL
  Filled 2019-09-26 (×2): qty 1

## 2019-09-26 MED ORDER — METHOCARBAMOL 500 MG IVPB - SIMPLE MED
INTRAVENOUS | Status: AC
  Filled 2019-09-26: qty 50

## 2019-09-26 MED ORDER — DEXAMETHASONE SODIUM PHOSPHATE 10 MG/ML IJ SOLN
INTRAMUSCULAR | Status: AC
  Filled 2019-09-26: qty 1

## 2019-09-26 MED ORDER — SUGAMMADEX SODIUM 200 MG/2ML IV SOLN
INTRAVENOUS | Status: DC | PRN
  Administered 2019-09-26: 170 mg via INTRAVENOUS

## 2019-09-26 MED ORDER — MAGNESIUM CITRATE PO SOLN: 1.0000 | Freq: Once | ORAL | Status: DC | PRN

## 2019-09-26 MED ORDER — LACTATED RINGERS IV SOLN: INTRAVENOUS | Status: DC

## 2019-09-26 SURGICAL SUPPLY — 81 items
BAG DECANTER FOR FLEXI CONT (MISCELLANEOUS) ×3 IMPLANT
BAG ZIPLOCK 12X15 (MISCELLANEOUS) ×3 IMPLANT
BLADE SAW SGTL 11.0X1.19X90.0M (BLADE) IMPLANT
BLADE SAW SGTL 18X1.27X75 (BLADE) IMPLANT
BLADE SAW SGTL 18X1.27X75MM (BLADE)
BRUSH FEMORAL CANAL (MISCELLANEOUS) IMPLANT
CLOTH BEACON ORANGE TIMEOUT ST (SAFETY) ×3 IMPLANT
COVER PERINEAL POST (MISCELLANEOUS) ×3 IMPLANT
COVER SURGICAL LIGHT HANDLE (MISCELLANEOUS) ×3 IMPLANT
COVER WAND RF STERILE (DRAPES) IMPLANT
CUP ACET PINNACLE SECTR 60MM (Hips) ×1 IMPLANT
CUP SECTOR GRIPTON 58MM (Orthopedic Implant) IMPLANT
DERMABOND ADVANCED (GAUZE/BANDAGES/DRESSINGS) ×2
DERMABOND ADVANCED .7 DNX12 (GAUZE/BANDAGES/DRESSINGS) ×1 IMPLANT
DRAPE ORTHO SPLIT 77X108 STRL (DRAPES) ×4
DRAPE POUCH INSTRU U-SHP 10X18 (DRAPES) ×3 IMPLANT
DRAPE STERI IOBAN 125X83 (DRAPES) ×3 IMPLANT
DRAPE SURG 17X11 SM STRL (DRAPES) ×3 IMPLANT
DRAPE SURG ORHT 6 SPLT 77X108 (DRAPES) ×2 IMPLANT
DRAPE U-SHAPE 47X51 STRL (DRAPES) ×9 IMPLANT
DRESSING AQUACEL AG SP 3.5X10 (GAUZE/BANDAGES/DRESSINGS) ×1 IMPLANT
DRSG AQUACEL AG ADV 3.5X10 (GAUZE/BANDAGES/DRESSINGS) ×3 IMPLANT
DRSG AQUACEL AG ADV 3.5X14 (GAUZE/BANDAGES/DRESSINGS) IMPLANT
DRSG AQUACEL AG SP 3.5X10 (GAUZE/BANDAGES/DRESSINGS) ×3
DURAPREP 26ML APPLICATOR (WOUND CARE) ×3 IMPLANT
ELECT BLADE TIP CTD 4 INCH (ELECTRODE) ×3 IMPLANT
ELECT REM PT RETURN 15FT ADLT (MISCELLANEOUS) ×3 IMPLANT
ELIMINATOR HOLE APEX DEPUY (Hips) ×3 IMPLANT
FACESHIELD WRAPAROUND (MASK) ×12 IMPLANT
GAUZE SPONGE 2X2 8PLY STRL LF (GAUZE/BANDAGES/DRESSINGS) ×1 IMPLANT
GLOVE BIO SURGEON STRL SZ 6 (GLOVE) IMPLANT
GLOVE BIOGEL M 7.0 STRL (GLOVE) IMPLANT
GLOVE BIOGEL PI IND STRL 6.5 (GLOVE) IMPLANT
GLOVE BIOGEL PI IND STRL 7.5 (GLOVE) ×2 IMPLANT
GLOVE BIOGEL PI IND STRL 8.5 (GLOVE) ×2 IMPLANT
GLOVE BIOGEL PI INDICATOR 6.5 (GLOVE)
GLOVE BIOGEL PI INDICATOR 7.5 (GLOVE) ×4
GLOVE BIOGEL PI INDICATOR 8.5 (GLOVE) ×4
GLOVE ECLIPSE 8.0 STRL XLNG CF (GLOVE) ×9 IMPLANT
GLOVE ORTHO TXT STRL SZ7.5 (GLOVE) ×6 IMPLANT
GOWN STRL REUS W/TWL 2XL LVL3 (GOWN DISPOSABLE) ×3 IMPLANT
GOWN STRL REUS W/TWL LRG LVL3 (GOWN DISPOSABLE) ×9 IMPLANT
GOWN STRL REUS W/TWL XL LVL3 (GOWN DISPOSABLE) ×3 IMPLANT
HANDPIECE INTERPULSE COAX TIP (DISPOSABLE)
HEAD FEM BIOLOX DELTA 36 8.5 (Orthopedic Implant) ×3 IMPLANT
HOLDER FOLEY CATH W/STRAP (MISCELLANEOUS) ×3 IMPLANT
KIT TURNOVER KIT A (KITS) IMPLANT
LINER NEUTRAL 58X36MM PLUS4 ×3 IMPLANT
MANIFOLD NEPTUNE II (INSTRUMENTS) ×3 IMPLANT
MARKER SKIN DUAL TIP RULER LAB (MISCELLANEOUS) ×3 IMPLANT
NDL SAFETY ECLIPSE 18X1.5 (NEEDLE) ×1 IMPLANT
NEEDLE HYPO 18GX1.5 SHARP (NEEDLE) ×2
NS IRRIG 1000ML POUR BTL (IV SOLUTION) ×6 IMPLANT
PACK ANTERIOR HIP CUSTOM (KITS) ×3 IMPLANT
PACK TOTAL JOINT (CUSTOM PROCEDURE TRAY) ×3 IMPLANT
PADDING CAST COTTON 6X4 STRL (CAST SUPPLIES) ×3 IMPLANT
PENCIL SMOKE EVACUATOR (MISCELLANEOUS) IMPLANT
PINNSECTOR W/GRIP ACE CUP 60MM (Hips) ×3 IMPLANT
PRESSURIZER FEMORAL UNIV (MISCELLANEOUS) IMPLANT
PROTECTOR NERVE ULNAR (MISCELLANEOUS) ×3 IMPLANT
SAW OSC TIP CART 19.5X105X1.3 (SAW) IMPLANT
SCREW 6.5MMX30MM (Screw) ×3 IMPLANT
SET HNDPC FAN SPRY TIP SCT (DISPOSABLE) IMPLANT
SPONGE GAUZE 2X2 STER 10/PKG (GAUZE/BANDAGES/DRESSINGS) ×2
SPONGE LAP 18X18 RF (DISPOSABLE) ×3 IMPLANT
SPONGE LAP 4X18 RFD (DISPOSABLE) ×3 IMPLANT
STAPLER VISISTAT 35W (STAPLE) ×3 IMPLANT
SUCTION FRAZIER HANDLE 10FR (MISCELLANEOUS) ×2
SUCTION TUBE FRAZIER 10FR DISP (MISCELLANEOUS) ×1 IMPLANT
SUT MNCRL AB 4-0 PS2 18 (SUTURE) ×3 IMPLANT
SUT STRATAFIX PDS+ 0 24IN (SUTURE) ×3 IMPLANT
SUT VIC AB 1 CT1 36 (SUTURE) ×12 IMPLANT
SUT VIC AB 2-0 CT1 27 (SUTURE) ×10
SUT VIC AB 2-0 CT1 TAPERPNT 27 (SUTURE) ×5 IMPLANT
SUT VLOC 180 0 24IN GS25 (SUTURE) IMPLANT
TOWEL OR 17X26 10 PK STRL BLUE (TOWEL DISPOSABLE) ×6 IMPLANT
TOWER CARTRIDGE SMART MIX (DISPOSABLE) IMPLANT
TRAY FOLEY MTR SLVR 16FR STAT (SET/KITS/TRAYS/PACK) ×3 IMPLANT
TUBE KAMVAC SUCTION (TUBING) IMPLANT
WATER STERILE IRR 1000ML POUR (IV SOLUTION) ×6 IMPLANT
YANKAUER SUCT BULB TIP 10FT TU (MISCELLANEOUS) ×3 IMPLANT

## 2019-09-26 NOTE — Anesthesia Procedure Notes (Signed)
Procedure Name: Intubation Date/Time: 09/26/2019 10:15 AM Performed by: Eben Burow, CRNA Pre-anesthesia Checklist: Patient identified, Emergency Drugs available, Suction available, Patient being monitored and Timeout performed Patient Re-evaluated:Patient Re-evaluated prior to induction Oxygen Delivery Method: Circle system utilized Preoxygenation: Pre-oxygenation with 100% oxygen Induction Type: IV induction Ventilation: Mask ventilation without difficulty Laryngoscope Size: Mac and 4 Grade View: Grade I Tube type: Oral Number of attempts: 1 Airway Equipment and Method: Stylet Placement Confirmation: ETT inserted through vocal cords under direct vision,  positive ETCO2 and breath sounds checked- equal and bilateral Secured at: 21 cm Tube secured with: Tape Dental Injury: Teeth and Oropharynx as per pre-operative assessment  Comments: Pt showed me loose teeth across bottom in preop.  Easy intubation, teeth as preop after intubation.

## 2019-09-26 NOTE — Transfer of Care (Signed)
Immediate Anesthesia Transfer of Care Note  Patient: Ariel Harrington  Procedure(s) Performed: ANTERIOR APPROACH TOTAL HIP REVISION, ACETABULAR COMPONENT (Left Hip)  Patient Location: PACU  Anesthesia Type:General  Level of Consciousness: awake, drowsy and patient cooperative  Airway & Oxygen Therapy: Patient Spontanous Breathing and Patient connected to face mask oxygen  Post-op Assessment: Report given to RN and Post -op Vital signs reviewed and stable  Post vital signs: Reviewed and stable  Last Vitals:  Vitals Value Taken Time  BP 164/94 09/26/19 1240  Temp    Pulse 88 09/26/19 1242  Resp 21 09/26/19 1242  SpO2 100 % 09/26/19 1242  Vitals shown include unvalidated device data.  Last Pain:  Vitals:   09/26/19 0639  TempSrc:   PainSc: 5          Complications: No complications documented.

## 2019-09-26 NOTE — Brief Op Note (Signed)
09/26/2019  9:06 AM  PATIENT:  Ariel Harrington  64 y.o. female  PRE-OPERATIVE DIAGNOSIS:  Failed left total hip replacement, persistent groin pain felt to be related to loose or not fully integrated acetabular shell  POST-OPERATIVE DIAGNOSIS:  Failed left total hip replacement related to persistent pain  PROCEDURE:  Procedure(s) with comments: ANTERIOR APPROACH TOTAL HIP REVISION, ACETABULAR COMPONENT (Left) - 2 HRS  SURGEON:  Surgeon(s) and Role:    Durene Romans, MD - Primary  PHYSICIAN ASSISTANT: Lanney Gins, PA-C  ANESTHESIA:   general  EBL:  600 cc   BLOOD ADMINISTERED:none  DRAINS: none   LOCAL MEDICATIONS USED:  NONE  SPECIMEN:  No Specimen  DISPOSITION OF SPECIMEN:  N/A  COUNTS:  YES  TOURNIQUET:  * No tourniquets in log *  DICTATION: .Other Dictation: Dictation Number [id number]  PLAN OF CARE: Admit to inpatient   PATIENT DISPOSITION:  PACU - hemodynamically stable.   Delay start of Pharmacological VTE agent (>24hrs) due to surgical blood loss or risk of bleeding: no

## 2019-09-26 NOTE — Op Note (Signed)
Ariel Harrington, Ariel Harrington MEDICAL RECORD TR:[id number] ACCOUNT [id number] DATE OF BIRTH:25-Nov-1955 FACILITY: [facility] LOCATION: [facility] PHYSICIAN:Amelia Macken D. 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  POSTOPERATIVE DIAGNOSIS:  Clinical failure of left total hip arthroplasty related to her acetabular component with persistent groin pain.  FINDINGS:  Please see the operative note for findings during the procedure.  PROCEDURE:  Revision left total hip arthroplasty through anterior approach revising the acetabular component including the acetabular shell, liner as well as the femoral head ball, upsizing the femoral head ball to a 36+8.5 titanium inner sleeve ceramic  ball.  SURGEON:  Durene Romans, MD  ASSISTANT:  Lanney Gins, PA-C  ANESTHESIA:  General.  BLOOD LOSS:  600 mL.  DRAINS:  None.  COMPLICATIONS:  None apparent.  INDICATIONS:  The patient is a 64 year old female with history of left total hip arthroplasty that was performed approximately 2 years ago.  She had presented to the office in followup with persistent pain in the anterior aspect of her hip.  She has also  had a history of lumbar spine issues.  We had worked her hip up in the office clinically with serial radiographs and bone scan did not indicate significant concern for loosening.  Her index total hip arthroplasty was complicated by significant erosive  degenerative changes as well as erosive bone loss and required use of bone graft.  There was some concern regarding change of the position of the component in that postoperative period that led me to conclude with discussion with her that this was  possibly a source of her pain.  At this point, we had discussion regarding proceeding with revision of the left acetabular component based on her persistent pain with  the risks of infection, DVT, dislocation, neurovascular injury discussed, but most  importantly stressing that she could very well have persistent pain that is arising from a different source.  Given this discussion and the limitations and the guaranteeable results, she wished to proceed.  Consent was obtained for the benefit of  potential pain relief.  DESCRIPTION OF PROCEDURE:  The patient was brought to the operative theater.  Once adequate anesthesia, preoperative antibiotics, Ancef administered as well as tranexamic acid and Decadron.  She was positioned supine on the Hana table.  The left hip was  predraped then prepped and draped in sterile fashion.  A timeout was performed identifying the patient, the planned procedure and extremity.  Her old incision was marked on the skin and then excised sharply extending slightly proximal and distal for  exposure purposes.  The fascia of the tensor fascia lata muscle was exposed and incised.  The muscle belly was swept laterally.  We identified the scarring over the anterior aspect of the hip and were able to place retractors to excise this scar exposing  the anterior aspect of the hip.  With the anterior aspect of the hip exposed, we then applied traction through the Hana table and then external rotation.  I was able to remove the femoral ball previously placed.  I kept the leg was rotated at 100  degrees with traction off and placed retractors around the acetabulum.  I completed debridement of soft tissues around the hip as well as bony overgrowth to expose the acetabular liner.  The acetabular liner was removed.  I then removed the cancellous  screw within the ilium.  I then  replaced the liner, so I could use the Innomed explant system.  We used a 36 inner ball with a 56 starter and then 56 finishing blade to remove the acetabular shell with minimal bone loss.  With shell removed, I was able  to examine the acetabulum.  She had excellent incorporation of the  previously applied bone graft.  I reamed with a 55 reamer, then a 57 reamer, and then at this point felt that I had good sizing to go with a 58 mm cup, which was 2 mm larger than the  previously placed  cup.  I confirmed this radiographically.  However, when we then opened up the 58 mm Gription shell I was unable to get scratch-fit press-fit fixation.  I thus had to remove this cup and not use it.  I then reamed to a 58 mm reamer and  selected a 60 mm Gription Pinnacle shell and was able to impact this under fluoroscopic guidance to confirm appropriate orientation, but with this cup had good initial press-fit.  Given this finding, I did place a single cancellous screw into the ilium.   I then placed a trial liner and did a trial reduction.  Based on the trial reduction we selected the 8.5 mm ball to try to restore leg lengths and offset.  Given these findings and no signs of impingement and plenty of room posteriorly I selected 36+8.5  ceramic ball with a titanium sleeve insert.  The trial components were removed including the femoral head and the trial liner.  We placed a hole eliminator and the final 36+4 neutral Ultrex liner to match the 60 mm shell.  Once this was done, the 36+8.5  ball with a titanium sleeve was impacted on a clean and dried trunnion and the hip was reduced.  We irrigated the hip throughout the case with pulse lavage normal saline solution.  Based on the scar debridement there was no anterior capsule to  reapproximate.  Thus, the fascia of the tensor fascia lata was reapproximated using #1 Vicryl and Stratafix suture.  The remainder of the wound was closed with 2-0 Vicryl and a running Monocryl stitch.  The wound was then cleaned, dried and dressed  sterilely using surgical glue and Aquacel dressing.  She was then awoken from anesthesia and brought to the recovery room in stable condition.  Findings were reviewed with her son.  We will have her be partial weightbearing for at least 6  weeks to allow  for bony ingrowth of this  new shell.  Our hopes in anticipation through this procedure is that we will get some benefit.  At least at this point we will know the orientation of the component intraoperatively, the bone stock that was created with the  application of bone graft to confirm with her clinically and radiographically as we proceed.  CN/NUANCE  D:09/26/2019 T:09/26/2019 JOB:[id number]

## 2019-09-26 NOTE — Interval H&P Note (Signed)
History and Physical Interval Note:  09/26/2019 7:08 AM  Ariel Harrington  has presented today for surgery, with the diagnosis of Failed left total hip replacement, failure of acetabular.  The various methods of treatment have been discussed with the patient and family. After consideration of risks, benefits and other options for treatment, the patient has consented to  Procedure(s) with comments: ANTERIOR APPROACH TOTAL HIP REVISION, ACETABULAR COMPONENT (Left) - 2 HRS as a surgical intervention.  The patient's history has been reviewed, patient examined, no change in status, stable for surgery.  I have reviewed the patient's chart and labs.  Questions were answered to the patient's satisfaction.     Shelda Pal

## 2019-09-26 NOTE — Evaluation (Signed)
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  Pt will benefit from skilled PT to increase their independence and safety with mobility to allow discharge to the venue listed below.  Pt assisted OOB and to recliner POD #0.  Pt reports 7/10 left hip pain and also reports poor R knee strength.  Pt encouraged to use RW to assist and maintain PWB status.     Follow Up Recommendations Follow surgeon's recommendation for DC plan and follow-up therapies;Supervision/Assistance - 24 hour    Equipment Recommendations  None recommended by PT    Recommendations for Other Services       Precautions / Restrictions Precautions Precautions: Fall Restrictions Weight Bearing Restrictions: Yes LLE Weight Bearing: Partial weight bearing LLE Partial Weight Bearing Percentage or Pounds: 50%      Mobility  Bed Mobility Overal bed mobility: Needs Assistance Bed Mobility: Supine to Sit     Supine to sit: HOB elevated;+2 for physical assistance;Max assist     General bed mobility comments: assist for L LE over EOB, also required assist for trunk and scooting to EOB  Transfers Overall transfer level: Needs assistance Equipment used: Rolling walker (2 wheeled) Transfers: Sit to/from [employer] Sit to Stand: Mod assist;+2 physical assistance Stand pivot transfers: Min assist;+2 physical assistance       General transfer comment: verbal cues for UE and LE positioning, PWB status, assist to rise and steady, pt reports right knee "not good" limiting her  ability, able to take a couple steps over to recliner  Ambulation/Gait             General Gait Details: declined due to pain  Stairs            Wheelchair Mobility    Modified Rankin (Stroke Patients Only)       Balance                                             Pertinent Vitals/Pain Pain Assessment: 0-10 Pain Score: 7  Pain Location: left hip Pain Descriptors / Indicators: Sore;Aching;Tender Pain Intervention(s): Monitored during session;Repositioned;Ice applied;Patient requesting pain meds-RN notified    Home Living Family/patient expects to be discharged to:: Private residence Living Arrangements: Spouse/significant other Available Help at Discharge: Family Type of Home: House Home Access: Stairs to enter     Home Layout: One level Home Equipment: Environmental consultant - 2 wheels      Prior Function Level of Independence: Independent               Hand Dominance        Extremity/Trunk Assessment        Lower Extremity Assessment Lower Extremity Assessment: RLE deficits/detail;LLE deficits/detail RLE Deficits / Details: R knee pain and also needs TKA per pt, right knee larger in size compared to left LLE Deficits / Details: requiring assist for mobility       Communication   Communication: No difficulties  Cognition Arousal/Alertness: Suspect due to medications  Behavior During Therapy: [facility] for tasks assessed/performed                                   General Comments: decreased alertness and slow to follow commands, likely from medications      General Comments      Exercises     Assessment/Plan    PT Assessment Patient needs continued PT services  PT Problem List Decreased strength;Decreased balance;Decreased knowledge of use of DME;Pain;Decreased knowledge of precautions;Decreased mobility       PT Treatment Interventions Gait training;Balance training;DME instruction;Therapeutic  exercise;Functional mobility training;Therapeutic activities;Patient/family education;Stair training    PT Goals (Current goals can be found in the Care Plan section)  Acute Rehab PT Goals PT Goal Formulation: With patient Time For Goal Achievement: 10/03/19 Potential to Achieve Goals: Good    Frequency 7X/week   Barriers to discharge        Co-evaluation               AM-PAC PT "6 Clicks" Mobility  Outcome Measure Help needed turning from your back to your side while in a flat bed without using bedrails?: A Lot Help needed moving from lying on your back to sitting on the side of a flat bed without using bedrails?: A Lot Help needed moving to and from a bed to a chair (including a wheelchair)?: A Lot Help needed standing up from a chair using your arms (e.g., wheelchair or bedside chair)?: A Lot Help needed to walk in hospital room?: A Lot Help needed climbing 3-5 steps with a railing? : Total 6 Click Score: 11    End of Session Equipment Utilized During Treatment: Gait belt Activity Tolerance: Patient limited by pain Patient left: in chair;with call bell/phone within reach;with chair alarm set;with family/visitor present Nurse Communication: Mobility status;Patient requests pain meds PT Visit Diagnosis: Other abnormalities of gait and mobility (R26.89);Pain Pain - Right/Left: Left Pain - part of body: Hip    Time: 0277-4128 PT Time Calculation (min) (ACUTE ONLY): 16 min   Charges:   PT Evaluation $PT Eval Low Complexity: 1 Low        Kati PT, DPT Acute Rehabilitation Services Pager: [phone] Office: [phone]  Maida Sale E 09/26/2019, 4:35 PM

## 2019-09-26 NOTE — Discharge Instructions (Signed)

## 2019-09-27 ENCOUNTER — Encounter (HOSPITAL_COMMUNITY): Payer: Self-pay | Admitting: Orthopedic Surgery

## 2019-09-27 DIAGNOSIS — T8484XA Pain due to internal orthopedic prosthetic devices, implants and grafts, initial encounter: Secondary | ICD-10-CM | POA: Diagnosis not present

## 2019-09-27 LAB — CBC
HCT: 27.7 % — ABNORMAL LOW (ref 36.0–46.0)
Hemoglobin: 9 g/dL — ABNORMAL LOW (ref 12.0–15.0)
MCH: 28.9 pg (ref 26.0–34.0)
MCHC: 32.5 g/dL (ref 30.0–36.0)
MCV: 89.1 fL (ref 80.0–100.0)
Platelets: 169 10*3/uL (ref 150–400)
RBC: 3.11 MIL/uL — ABNORMAL LOW (ref 3.87–5.11)
RDW: 12.4 % (ref 11.5–15.5)
WBC: 8.1 10*3/uL (ref 4.0–10.5)
nRBC: 0 % (ref 0.0–0.2)

## 2019-09-27 LAB — BASIC METABOLIC PANEL
Anion gap: 8 (ref 5–15)
BUN: 14 mg/dL (ref 8–23)
CO2: 25 mmol/L (ref 22–32)
Calcium: 8.4 mg/dL — ABNORMAL LOW (ref 8.9–10.3)
Chloride: 101 mmol/L (ref 98–111)
Creatinine, Ser: 0.81 mg/dL (ref 0.44–1.00)
GFR calc Af Amer: >60 mL/min (ref >60)
GFR calc non Af Amer: >60 mL/min (ref >60)
Glucose, Bld: 177 mg/dL — ABNORMAL HIGH (ref 70–99)
Potassium: 4.5 mmol/L (ref 3.5–5.1)
Sodium: 134 mmol/L — ABNORMAL LOW (ref 135–145)

## 2019-09-27 MED ORDER — HYDROCODONE-ACETAMINOPHEN 7.5-325 MG PO TABS: 1.0000 | ORAL_TABLET | ORAL | 0 refills | Status: AC | PRN

## 2019-09-27 MED ORDER — DOCUSATE SODIUM 100 MG PO CAPS
100.0000 mg | ORAL_CAPSULE | Freq: Two times a day (BID) | ORAL | 0 refills | Status: AC
Start: 2019-09-27 — End: ?

## 2019-09-27 MED ORDER — POLYETHYLENE GLYCOL 3350 17 G PO PACK: 17.0000 g | PACK | Freq: Two times a day (BID) | ORAL | 0 refills | Status: AC

## 2019-09-27 MED ORDER — FERROUS SULFATE 325 (65 FE) MG PO TABS: 325.0000 mg | ORAL_TABLET | Freq: Three times a day (TID) | ORAL | 0 refills | Status: AC

## 2019-09-27 MED ORDER — METHOCARBAMOL 500 MG PO TABS: 500.0000 mg | ORAL_TABLET | Freq: Four times a day (QID) | ORAL | 0 refills | Status: AC | PRN

## 2019-09-27 NOTE — TOC Transition Note (Signed)
Transition of Care [facility]) - CM/SW Discharge Note   Patient Details  Name: Ariel Harrington MRN: [id number] Date of Birth: Oct 29, 1955  Transition of Care [facility]) CM/SW Contact:  Clearance Coots, LCSW Phone Number: 09/27/2019, 10:02 AM   Clinical Narrative:    Therapy Plan: OPPT [facility]ia [facility], [facility] RW delivered to the pt. Bedside. By [employer]   Final next level of care: OP Rehab Barriers to Discharge: No Barriers Identified   Patient Goals and CMS Choice     Choice offered to / list presented to : NA  Discharge Placement                       Discharge Plan and Services                DME Arranged: Walker rolling DME Agency: [employer] (Has a 3 IN 1) Date DME Agency Contacted: 09/27/19 Time DME Agency Contacted: 0900 Representative spoke with at DME Agency: Harrold Donath HH Arranged: NA HH Agency: NA        Social Determinants of Health (SDOH) Interventions     Readmission Risk Interventions No flowsheet data found.

## 2019-09-27 NOTE — Discharge Summary (Signed)
Patient ID: Ariel Harrington MRN: [id number] DOB/AGE: [birth year] 64 y.o.  Admit date: 09/26/2019 Discharge date: 09/27/2019  Admission Diagnoses:  Principal Problem:   S/P left TH revision   Discharge Diagnoses:  Same  Past Medical History:  Diagnosis Date  . Anemia   . Arthritis   . Asthma   . Depression   . Diabetes mellitus without complication (HCC)    type 2  . GERD (gastroesophageal reflux disease)   . Hypertension   . Radiculopathy   . Scoliosis     Surgeries: Procedure(s): LEFT ANTERIOR APPROACH TOTAL HIP REVISION, ACETABULAR COMPONENT on 09/26/2019   Consultants: N/A  Discharged Condition: Improved  Hospital Course: Ariel Harrington is an 64 y.o. female who was admitted 09/26/2019 for operative treatment of failed left total hip arthroplasty, acetabular component. Patient has severe unremitting pain that affects sleep, daily activities, and work/hobbies. After pre-op clearance the patient was taken to the operating room on 09/26/2019 and underwent  Procedure(s):  LEFT ANTERIOR APPROACH TOTAL HIP REVISION, ACETABULAR COMPONENT.    Patient was given perioperative antibiotics:  Anti-infectives (From admission, onward)   Start     Dose/Rate Route Frequency Ordered Stop   09/26/19 1600  ceFAZolin (ANCEF) IVPB 2g/100 mL premix        2 g 200 mL/hr over 30 Minutes Intravenous Every 6 hours 09/26/19 1518 09/26/19 2139   09/26/19 0630  ceFAZolin (ANCEF) IVPB 2g/100 mL premix        2 g 200 mL/hr over 30 Minutes Intravenous On call to O.R. 09/26/19 0617 09/26/19 1017       Patient was given sequential compression devices, early ambulation, and chemoprophylaxis to prevent DVT.  Patient benefited maximally from hospital stay and there were no complications.    Recent vital signs:  Patient Vitals for the past 24 hrs:  BP Temp Temp src Pulse Resp SpO2  09/27/19 0933 (!) 107/53 98.3 F (36.8 C) Oral 90 17 100 %  09/27/19 0556 (!) 148/74 98.4 F (36.9 C) Oral 76 18 99  %  09/27/19 0139 108/60 98.3 F (36.8 C) Oral 80 16 98 %  09/26/19 2203 134/80 98.7 F (37.1 C) Oral 82 18 100 %  09/26/19 1851 -- -- -- 90 -- --  09/26/19 1742 115/73 98.5 F (36.9 C) Oral (!) 110 18 99 %  09/26/19 1631 (!) 143/85 98 F (36.7 C) Oral 92 17 96 %  09/26/19 1611 (!) 147/93 -- -- 100 -- --  09/26/19 1515 (!) 155/90 98.2 F (36.8 C) Oral 88 15 100 %  09/26/19 1445 (!) 157/89 -- -- 82 15 100 %  09/26/19 1430 (!) 147/89 -- -- 78 14 100 %  09/26/19 1426 -- -- -- 86 13 100 %  09/26/19 1415 (!) 155/80 -- -- 78 18 100 %  09/26/19 1400 (!) 152/89 -- -- 81 19 100 %  09/26/19 1345 (!) 160/86 (!) 97.5 F (36.4 C) -- 79 16 100 %  09/26/19 1330 (!) 147/91 -- -- 76 (!) 9 100 %  09/26/19 1315 (!) 169/109 -- -- 76 13 100 %  09/26/19 1300 (!) 175/103 -- -- 73 (!) 22 100 %  09/26/19 1245 (!) 165/105 -- -- 83 (!) 23 100 %  09/26/19 1240 (!) 164/94 97.6 F (36.4 C) -- 90 16 100 %     Recent laboratory studies:  Recent Labs    09/27/19 0300  WBC 8.1  HGB 9.0*  HCT 27.7*  PLT 169  NA 134*  K 4.5  CL 101  CO2 25  BUN 14  CREATININE 0.81  GLUCOSE 177*  CALCIUM 8.4*     Discharge Medications:   Allergies as of 09/27/2019      Reactions   Bactrim [sulfamethoxazole-trimethoprim] Shortness Of Breath   Aspirin Other (See Comments)   Irritates stomach   Codeine Other (See Comments)   Sweat and get really hot   Gabapentin Itching   Ibuprofen Other (See Comments)   Irritates stomach   Lyrica [pregabalin] Other (See Comments)   Blister      Medication List    STOP taking these medications   diclofenac Sodium 1 % Gel Commonly known as: VOLTAREN     TAKE these medications   albuterol 108 (90 Base) MCG/ACT inhaler Commonly known as: VENTOLIN HFA Inhale 2 puffs into the lungs every 6 (six) hours as needed for wheezing or shortness of breath.   carvedilol 12.5 MG tablet Commonly known as: COREG Take 12.5 mg by mouth 2 (two) times daily.   cilostazol 100 MG  tablet Commonly known as: PLETAL Take 100 mg by mouth 2 (two) times daily.   docusate sodium 100 MG capsule Commonly known as: Colace Take 1 capsule (100 mg total) by mouth 2 (two) times daily. What changed:   when to take this  reasons to take this   DULoxetine 60 MG capsule Commonly known as: CYMBALTA Take 60 mg by mouth in the morning and at bedtime.   ferrous sulfate 325 (65 FE) MG tablet Commonly known as: FerrouSul Take 1 tablet (325 mg total) by mouth 3 (three) times daily with meals for 14 days.   fluticasone 50 MCG/ACT nasal spray Commonly known as: FLONASE Place 1-2 sprays into both nostrils 2 (two) times daily as needed for allergies or rhinitis.   HYDROcodone-acetaminophen 7.5-325 MG tablet Commonly known as: Norco Take 1-2 tablets by mouth every 4 (four) hours as needed for moderate pain. What changed:   how much to take  when to take this  reasons to take this   Januvia 100 MG tablet Generic drug: sitaGLIPtin Take 100 mg by mouth daily.   Livalo 4 MG Tabs Generic drug: Pitavastatin Calcium Take 4 mg by mouth daily.   metFORMIN 1000 MG tablet Commonly known as: GLUCOPHAGE Take 1,000 mg by mouth in the morning and at bedtime.   methocarbamol 500 MG tablet Commonly known as: Robaxin Take 1 tablet (500 mg total) by mouth every 6 (six) hours as needed for muscle spasms.   olmesartan 20 MG tablet Commonly known as: BENICAR Take 20 mg by mouth daily.   pantoprazole 40 MG tablet Commonly known as: PROTONIX Take 40 mg by mouth daily.   polyethylene glycol 17 g packet Commonly known as: MIRALAX / GLYCOLAX Take 17 g by mouth 2 (two) times daily.            Discharge Care Instructions  (From admission, onward)         Start     Ordered   09/27/19 0000  Change dressing       Comments: Maintain surgical dressing until follow up in the clinic. If the edges start to pull up, may reinforce with tape. If the dressing is no longer working, may  remove and cover with gauze and tape, but must keep the area dry and clean.  Call with any questions or concerns.   09/27/19 1130          Diagnostic Studies: DG Pelvis Portable  Result Date: 09/26/2019 CLINICAL  DATA:  64 year old female status post hip surgery EXAM: PORTABLE PELVIS 1-2 VIEWS COMPARISON:  None. FINDINGS: Postoperative changes of left total hip arthroplasty with gas at the surgical site. Acetabular components are aligned. Degenerative changes of the right hip. IMPRESSION: Negative. Electronically Signed   By: Gilmer Mor D.O.   On: 09/26/2019 13:23   DG C-Arm 1-60 Min-No Report  Result Date: 09/26/2019 Fluoroscopy was utilized by the requesting physician.  No radiographic interpretation.   DG HIP OPERATIVE UNILAT W OR W/O PELVIS LEFT  Result Date: 09/26/2019 CLINICAL DATA:  Status post total hip replacement revision EXAM: OPERATIVE LEFT HIP (WITH PELVIS IF PERFORMED) 1 VIEW TECHNIQUE: Fluoroscopic spot image(s) were submitted for interpretation post-operatively. COMPARISON:  None. FLUOROSCOPY TIME:  0 minutes 15 seconds; 4 acquired images FINDINGS: Frontal views obtained. There is a total hip replacement on the left with prosthetic components well-seated. No acute fracture or dislocation. 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 There is moderate narrowing of the right hip joint. IMPRESSION: Total hip replacement on the left with prosthetic components well-seated on frontal view. 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 No fracture or dislocation. Moderate narrowing right hip joint. Electronically Signed   By: Bretta Bang III M.D.   On: 09/26/2019 14:08    Disposition: Home  Discharge Instructions    Call MD / Call 911   Complete by: As directed    If you experience chest pain or shortness of breath, CALL 911 and be transported to the hospital emergency room.  If you develope a fever  above 101 F, pus (white drainage) or increased drainage or redness at the wound, or calf pain, call your surgeon's office.   Change dressing   Complete by: As directed    Maintain surgical dressing until follow up in the clinic. If the edges start to pull up, may reinforce with tape. If the dressing is no longer working, may remove and cover with gauze and tape, but must keep the area dry and clean.  Call with any questions or concerns.   Constipation Prevention   Complete by: As directed    Drink plenty of fluids.  Prune juice may be helpful.  You may use a stool softener, such as Colace (over the counter) 100 mg twice a day.  Use MiraLax (over the counter) for constipation as needed.   Diet - low sodium heart healthy   Complete by: As directed    Discharge instructions   Complete by: As directed    Maintain surgical dressing until follow up in the clinic. If the edges start to pull up, may reinforce with tape. If the dressing is no longer working, may remove and cover with gauze and tape, but must keep the area dry and clean.  Follow up in 2 weeks at [facility]. Call with any questions or concerns.   Increase activity slowly as tolerated   Complete by: As directed    Partial weight bearing with assist device as directed.  50% WB left leg   TED hose   Complete by: As directed    Use stockings (TED hose) for 2 weeks on both leg(s).  You may remove them at night for sleeping.       Follow-up Information    Durene Romans, MD. Schedule an appointment as soon as possible for a visit in 2 weeks.   Specialty: Orthopedic Surgery Contact information: [address] [place]  [address] [phone]                Signed: Genelle Gather [facility] 09/27/2019, 11:32 AM

## 2019-09-27 NOTE — Plan of Care (Signed)
Patient discharged home in stable condition, she is going to eat lunch and then will leave.

## 2019-09-27 NOTE — Progress Notes (Signed)
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  Pt aware to use RW and maintain PWB upon d/c.  Pt also performed LE exercises in sitting and supine.  Pt plans to f/u with OP PT.  Pt reports she has no stairs or steps at home.  Pt feels ready for d/c home today.   Follow Up Recommendations  Follow surgeon's recommendation for DC plan and follow-up therapies;Supervision/Assistance - 24 hour     Equipment Recommendations  None recommended by PT    Recommendations for Other Services       Precautions / Restrictions Precautions Precautions: Fall Restrictions Weight Bearing Restrictions: Yes LLE Weight Bearing: Partial weight bearing LLE Partial Weight Bearing Percentage or Pounds: 50%    Mobility  Bed Mobility               General bed mobility comments: pt up in recliner (states she had wash up in bathroom earlier)  Transfers Overall transfer level: Needs assistance Equipment used: Rolling walker (2 wheeled) Transfers: Sit to/from Stand Sit to Stand: Min guard         General transfer comment: verbal cues for UE and LE positioning, aware of PWB status  Ambulation/Gait Ambulation/Gait assistance: Min guard Gait Distance (Feet): 100 Feet Assistive device: Rolling walker (2 wheeled) Gait Pattern/deviations: Step-to pattern;Antalgic Gait velocity: decr   General Gait Details: verbal cues for sequence, RW positioning, step length   Stairs             Wheelchair Mobility    Modified Rankin (Stroke Patients Only)       Balance                                             Cognition Arousal/Alertness: Awake/alert Behavior During Therapy: WFL for tasks assessed/performed Overall Cognitive Status: Within Functional Limits for tasks assessed                                        Exercises Total Joint Exercises Ankle Circles/Pumps: AROM;Both;10 reps Quad Sets: AROM;Both;10 reps Heel Slides: AAROM;10 reps;Left Hip ABduction/ADduction: AAROM;Left;10 reps Long Arc Quad: AROM;Left;Seated;10 reps    General Comments        Pertinent Vitals/Pain Pain Assessment: 0-10 Pain Score: 4  Pain Location: left hip Pain Descriptors / Indicators: Sore;Aching;Tender Pain Intervention(s): Monitored during session;Ice applied;Repositioned    Home Living                      Prior Function            PT Goals (current goals can now be found in the care plan section) Progress towards PT goals: Progressing toward goals    Frequency    7X/week      PT Plan Current plan remains appropriate    Co-evaluation              AM-PAC PT "6 Clicks" Mobility  Outcome Measure  Help needed turning from your back to your side while in a flat bed without using bedrails?: A Little Help needed moving from lying on your back to sitting on the side of a flat bed without using bedrails?: A Little Help needed moving to and from a bed to a chair (including a wheelchair)?: A Little Help needed standing up from a chair using your arms (e.g., wheelchair or bedside chair)?: A Little Help needed to walk in hospital room?: A Little Help needed climbing 3-5 steps with a railing? : A Little 6 Click Score: 18    End of Session Equipment Utilized During Treatment: Gait belt Activity Tolerance: Patient tolerated treatment well Patient left: in chair;with call bell/phone within reach;with chair alarm set Nurse Communication: Mobility status PT Visit Diagnosis: Other abnormalities of gait and mobility (R26.89);Pain Pain - Right/Left:  Left Pain - part of body: Hip     Time: 1308-6578 PT Time Calculation (min) (ACUTE ONLY): 19 min  Charges:  $Gait Training: 8-22 mins                     Ariel Harrington, DPT Acute Rehabilitation Services Pager: [phone] Office: [phone]  Maida Sale E 09/27/2019, 2:18 PM

## 2019-09-27 NOTE — Anesthesia Postprocedure Evaluation (Signed)
Anesthesia Post Note  Patient: Ariel Harrington  Procedure(s) Performed: ANTERIOR APPROACH TOTAL HIP REVISION, ACETABULAR COMPONENT (Left Hip)     Patient location during evaluation: PACU Anesthesia Type: General Level of consciousness: awake and alert Pain management: pain level controlled Vital Signs Assessment: post-procedure vital signs reviewed and stable Respiratory status: spontaneous breathing, nonlabored ventilation, respiratory function stable and patient connected to nasal cannula oxygen Cardiovascular status: blood pressure returned to baseline and stable Postop Assessment: no apparent nausea or vomiting Anesthetic complications: no   No complications documented.  Last Vitals:  Vitals:   09/27/19 0139 09/27/19 0556  BP: 108/60 (!) 148/74  Pulse: 80 76  Resp: 16 18  Temp: 36.8 C 36.9 C  SpO2: 98% 99%    Last Pain:  Vitals:   09/27/19 0742  TempSrc:   PainSc: 4                  Shelli Portilla L Adiel Mcnamara

## 2019-09-27 NOTE — Progress Notes (Signed)
Patient ID: Ariel Harrington, female   DOB: May 21, 1955, 64 y.o.   MRN: [id number] Subjective: 1 Day Post-Op Procedure(s) (LRB): ANTERIOR APPROACH TOTAL HIP REVISION, ACETABULAR COMPONENT (Left)    Patient reports pain as mild to moderate.  Comfortable this am.  No events over night. Reviewed intra-operative findings and procedure - goals  Objective:   VITALS:   Vitals:   09/27/19 0139 09/27/19 0556  BP: 108/60 (!) 148/74  Pulse: 80 76  Resp: 16 18  Temp: 98.3 F (36.8 C) 98.4 F (36.9 C)  SpO2: 98% 99%    Neurovascular intact Incision: dressing C/D/I  LABS Recent Labs    09/27/19 0300  HGB 9.0*  HCT 27.7*  WBC 8.1  PLT 169    Recent Labs    09/27/19 0300  NA 134*  K 4.5  BUN 14  CREATININE 0.81  GLUCOSE 177*    No results for input(s): LABPT, INR in the last 72 hours.   Assessment/Plan: 1 Day Post-Op Procedure(s) (LRB): ANTERIOR APPROACH TOTAL HIP REVISION, ACETABULAR COMPONENT (Left)   Advance diet Up with therapy - PWB, 50% LLE RTC in 2 weeks  If she clears therapy she can be discharged home later today

## 2020-02-15 IMAGING — NM NM BONE 3 PHASE
10 series · 20 of 20 positions shown · non-contrast
Comparison: Radiograph 03/07/2017

CLINICAL DATA: LEFT hip pain. Concern for loosening infection LEFT
hip prosthetic.

EXAM:
NUCLEAR MEDICINE 3-PHASE BONE SCAN
TECHNIQUE: Radionuclide angiographic images, immediate static blood pool
images, and 3-hour delayed static images were obtained of the hips
after intravenous injection of radiopharmaceutical.
RADIOPHARMACEUTICALS:  21.4 mCi Oc-RRm MDP IV

[Series 1: flow · 2.07mm/px · 6 of 48 frames shown (1 of 2)]
[frame 5/48  full-range]
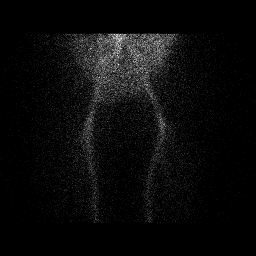
[frame 13/48  full-range]
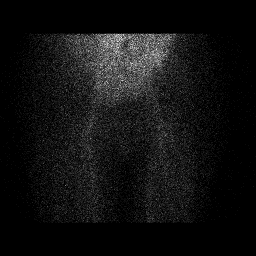
[frame 21/48  full-range]
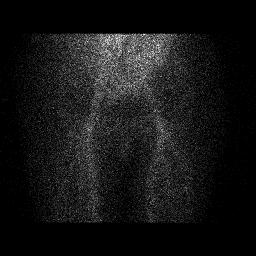
[frame 29/48  full-range]
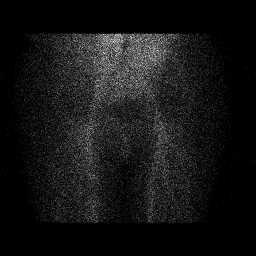
[frame 37/48  full-range]
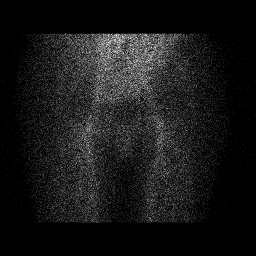
[frame 45/48  full-range]
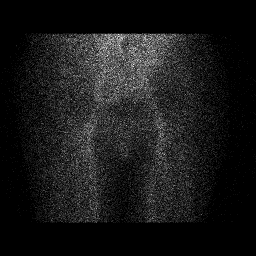

[Series 1: flow · 2.07mm/px · 6 of 48 frames shown (2 of 2)]
[frame 5/48  full-range]
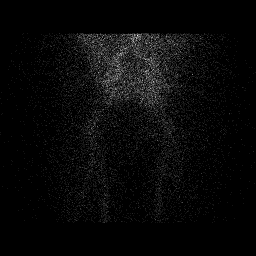
[frame 13/48  full-range]
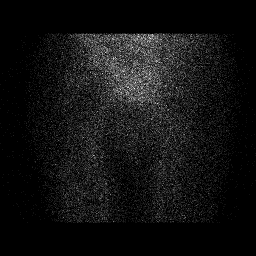
[frame 21/48  full-range]
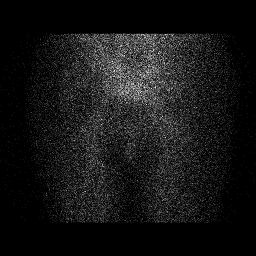
[frame 29/48  full-range]
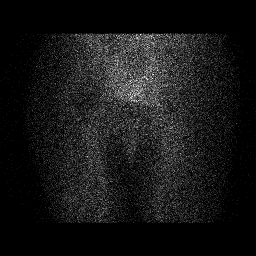
[frame 37/48  full-range]
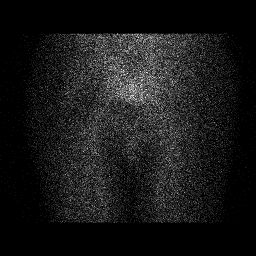
[frame 45/48  full-range]
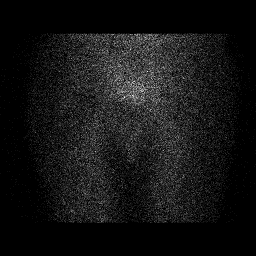

[Series 2: blood pool · 2.07mm/px · 1 of 1 slices shown (1 of 2)]
[im 1/1]
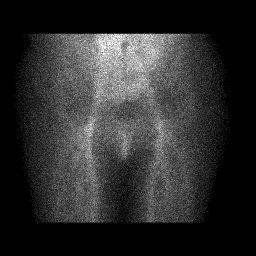

[Series 2: blood pool · 2.07mm/px · 1 of 1 slices shown (2 of 2)]
[im 1/1]
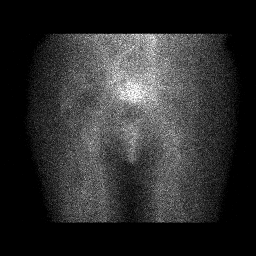

[Series 3: lat bp · 2.07mm/px · 1 of 1 slices shown (1 of 2)]
[im 1/1]
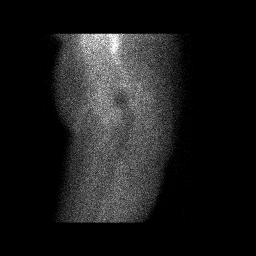

[Series 3: lat bp · 2.07mm/px · 1 of 1 slices shown (2 of 2)]
[im 1/1]
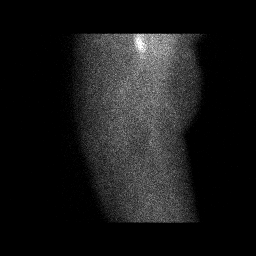

[Series 4: delay · delayed · 2.07mm/px · 1 of 1 slices shown (1 of 4)]
[im 1/1]
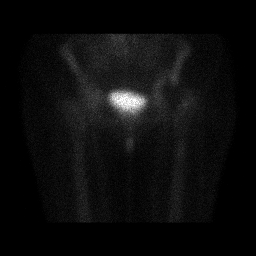

[Series 4: delay · delayed · 2.07mm/px · 1 of 1 slices shown (2 of 4)]
[im 1/1]
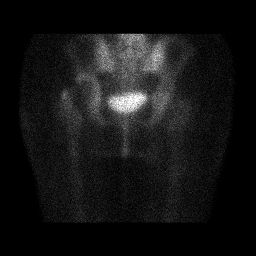

[Series 5: delay · delayed · 2.07mm/px · 1 of 1 slices shown (3 of 4)]
[im 1/1]
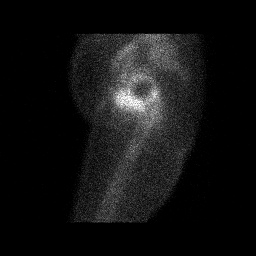

[Series 5: delay · delayed · 2.07mm/px · 1 of 1 slices shown (4 of 4)]
[im 1/1]
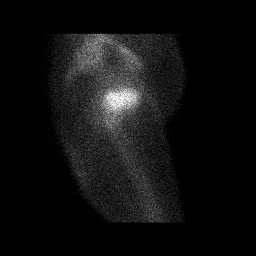

[20 of 20 positions shown; findings below may reference images not displayed]

FINDINGS: Vascular phase: No asymmetric or increased blood flow to the LEFT or
RIGHT hip.

Blood pool phase: No abnormal blood pool activity.

Delayed phase: Minimal periprosthetic uptake in the LEFT hip.
Photopenia noted at the prosthetic femoral head.

Degenerative uptake in the RIGHT acetabular
IMPRESSION: No evidence of loosening infection of the LEFT hip prosthetic

## 2020-09-29 IMAGING — RF DG HIP (WITH PELVIS) OPERATIVE*L*
1 series · 5 of 5 positions shown · non-contrast
Comparison: None.

FLUOROSCOPY TIME:  0 minutes 15 seconds; 4 acquired images

CLINICAL DATA: Status post total hip replacement revision

EXAM:
OPERATIVE LEFT HIP (WITH PELVIS IF PERFORMED) 1 VIEW
TECHNIQUE: Fluoroscopic spot image(s) were submitted for interpretation
post-operatively.

[Series 1: unknown protocol · 0.20mm/px · 5 of 5 slices shown]
[im 1/5]
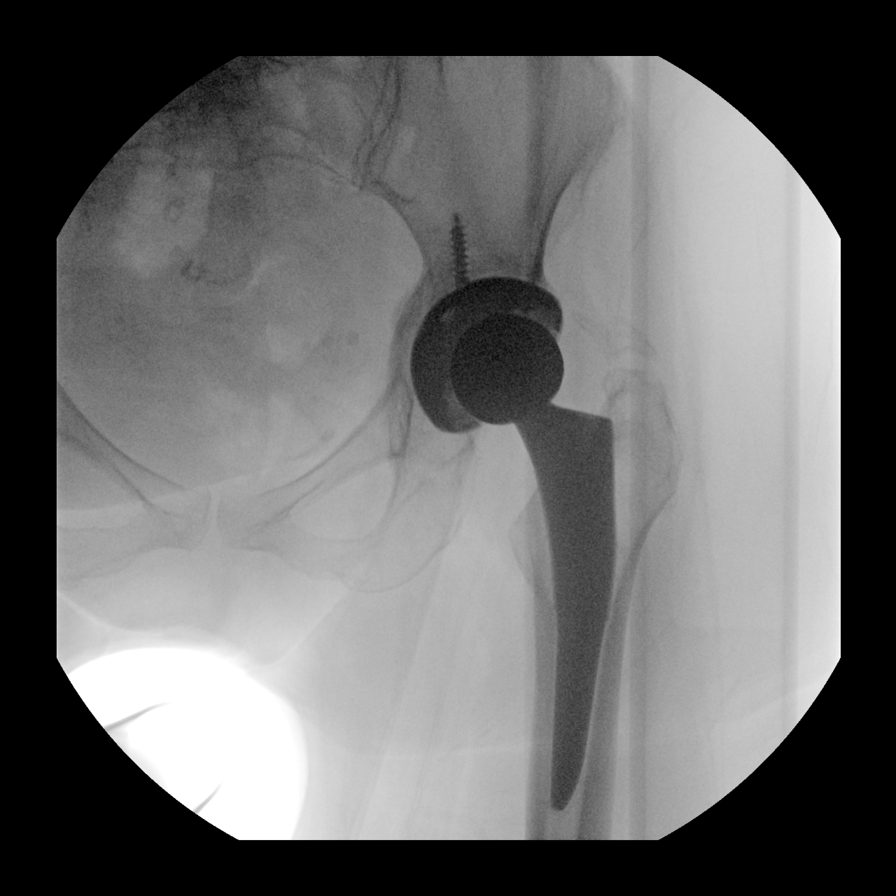
[im 2/5]
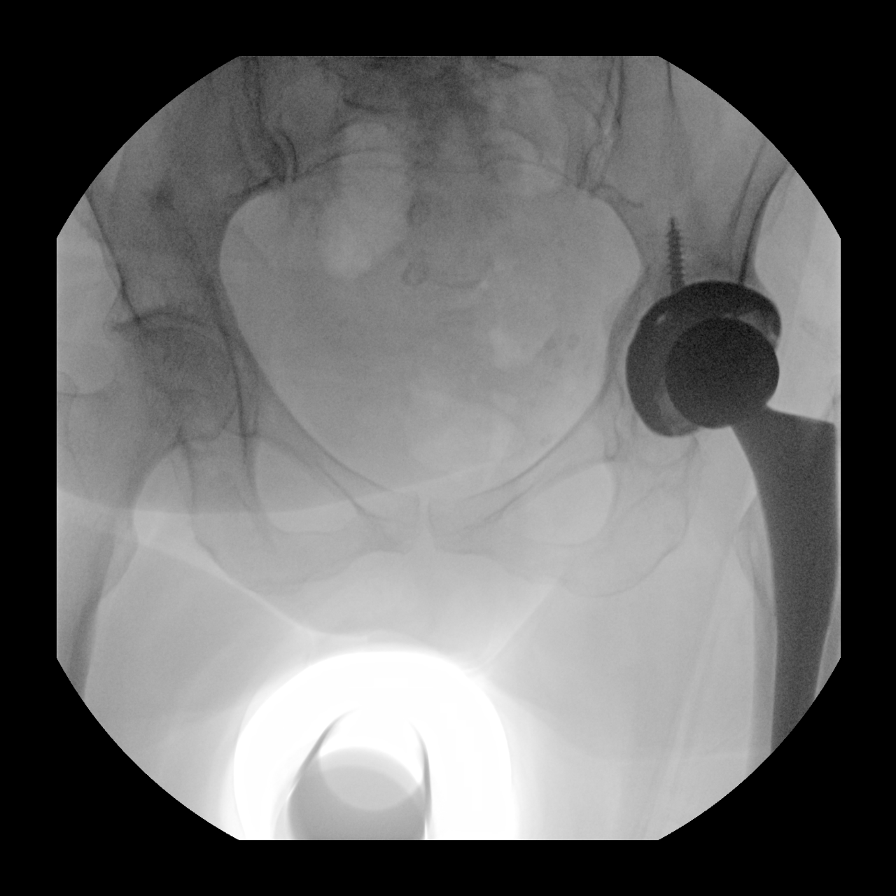
[im 3/5]
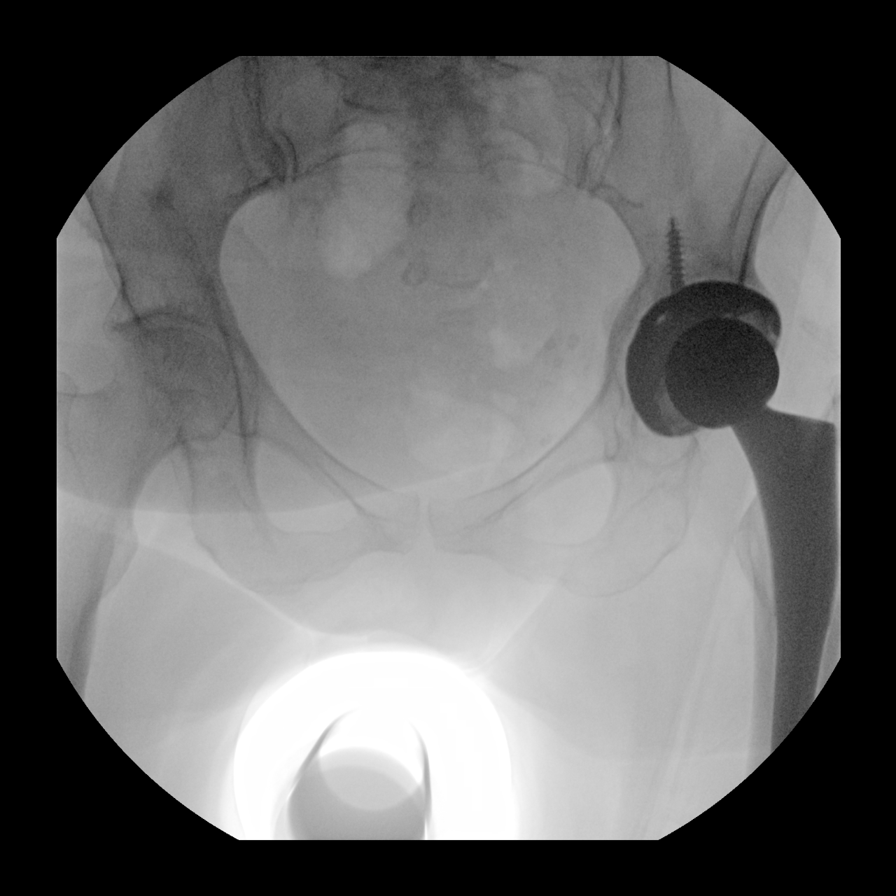
[im 4/5]
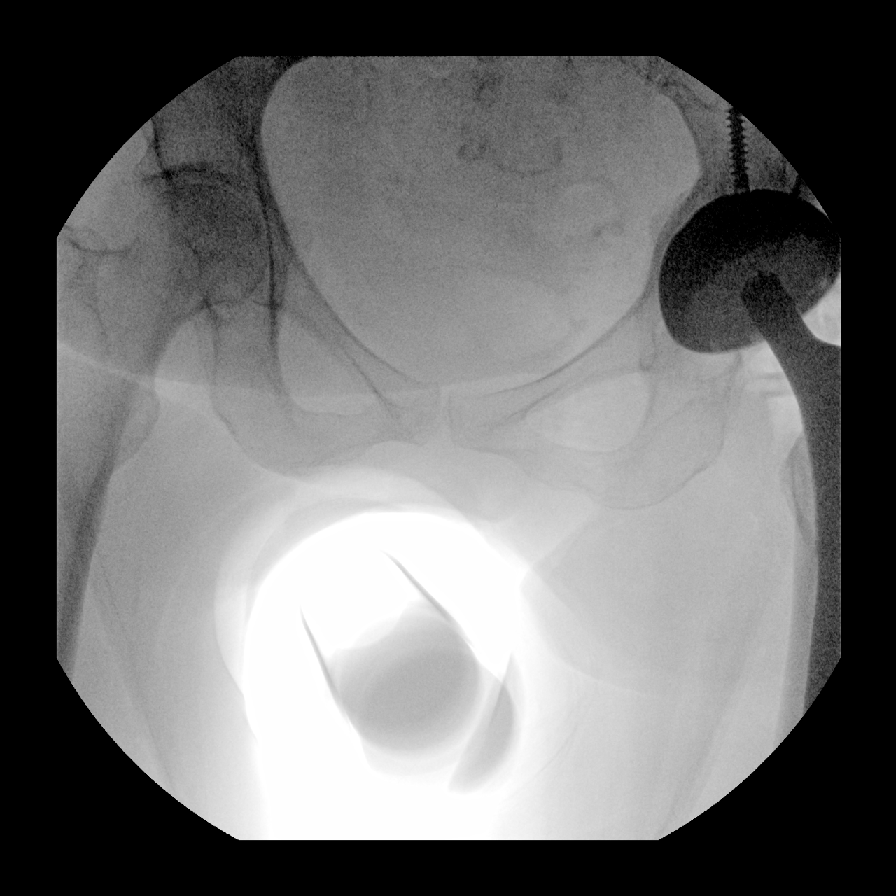
[im 5/5]
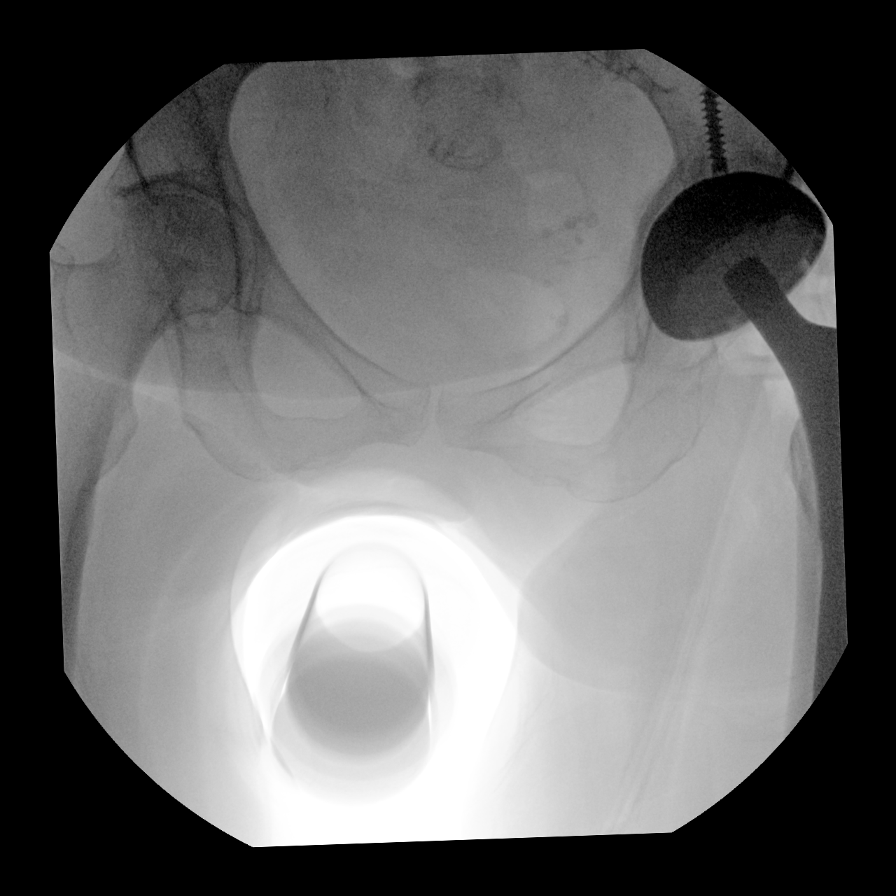

[5 of 5 positions shown; findings below may reference images not displayed]

FINDINGS: Frontal views obtained. There is a total hip replacement on the left
with prosthetic components well-seated. No acute fracture or
dislocation. Calcification is noted superior to the greater
trochanter, likely due to myositis ossificans. There is moderate
narrowing of the right hip joint.
IMPRESSION: Total hip replacement on the left with prosthetic components
well-seated on frontal view. Probable myositis ossificans superior
to the greater trochanter on the left. No fracture or dislocation.
Moderate narrowing right hip joint.
